# Patient Record
Sex: Female | Born: 1955
Health system: Southern US, Community
[De-identification: ages and names within clinical notes are randomized; demographics above are authoritative.]

## PROBLEM LIST (undated history)

## (undated) HISTORY — PX: PLACEMENT OF BREAST IMPLANTS: SHX6334

---

## 2012-01-18 ENCOUNTER — Ambulatory Visit: Payer: Self-pay

## 2012-02-05 ENCOUNTER — Ambulatory Visit: Payer: Self-pay

## 2018-03-21 DIAGNOSIS — M19031 Primary osteoarthritis, right wrist: Secondary | ICD-10-CM

## 2018-03-21 HISTORY — DX: Primary osteoarthritis, right wrist: M19.031

## 2018-05-17 ENCOUNTER — Other Ambulatory Visit: Payer: Self-pay | Admitting: Family Medicine

## 2018-05-17 DIAGNOSIS — N6489 Other specified disorders of breast: Secondary | ICD-10-CM

## 2018-05-23 ENCOUNTER — Ambulatory Visit
Admission: RE | Admit: 2018-05-23 | Discharge: 2018-05-23 | Disposition: A | Discharge status: Home / Self Care | Payer: Commercial Managed Care - PPO | Source: Ambulatory Visit | Attending: Family Medicine | Admitting: Family Medicine

## 2018-05-23 ENCOUNTER — Other Ambulatory Visit: Payer: Self-pay | Admitting: Family Medicine

## 2018-05-23 ENCOUNTER — Ambulatory Visit: Payer: Self-pay

## 2018-05-23 DIAGNOSIS — N6489 Other specified disorders of breast: Secondary | ICD-10-CM

## 2018-05-23 HISTORY — PX: BREAST BIOPSY: SHX20

## 2018-07-05 HISTORY — PX: BREAST BIOPSY: SHX20

## 2018-07-12 DIAGNOSIS — R922 Inconclusive mammogram: Secondary | ICD-10-CM

## 2018-07-12 HISTORY — DX: Inconclusive mammogram: R92.2

## 2019-04-08 IMAGING — MG STEREOTACTIC CORE NEEDLE BIOPSY
8 of 13 series · 8 of 29 positions shown · non-contrast
Comparison: Previous exams.

ADDENDUM:
Pathology revealed COMPLEX SCLEROSING LESION of the Left breast,
lower inner quadrant. This was found to be concordant by Dr.
Herlin Carolina, with excision recommended.

**Note that the clip significantly migrated at the time of the
biopsy, so the distortion itself, not the clip, will need to be
localized for excision.
Pathology results were discussed with the patient by telephone. The
patient reported doing well after the biopsy with tenderness at the
site. Post biopsy instructions and care were reviewed and questions
were answered. The patient was encouraged to call The [REDACTED]
A surgical referral will be arranged by Dr. Seifu Haji Yuye of [REDACTED] in [HOSPITAL][HOSPITAL]. Imaging and pathology reports were
faxed to Dr. Assasin Poyo on May 24, 2018.
Pathology results reported by Akame, RN on 05/24/2018.
CLINICAL DATA: 62-year-old female presenting for stereotactic
biopsy of distortion in the left breast.
EXAM:
LEFT BREAST STEREOTACTIC CORE NEEDLE BIOPSY

[L (1 of 8)]
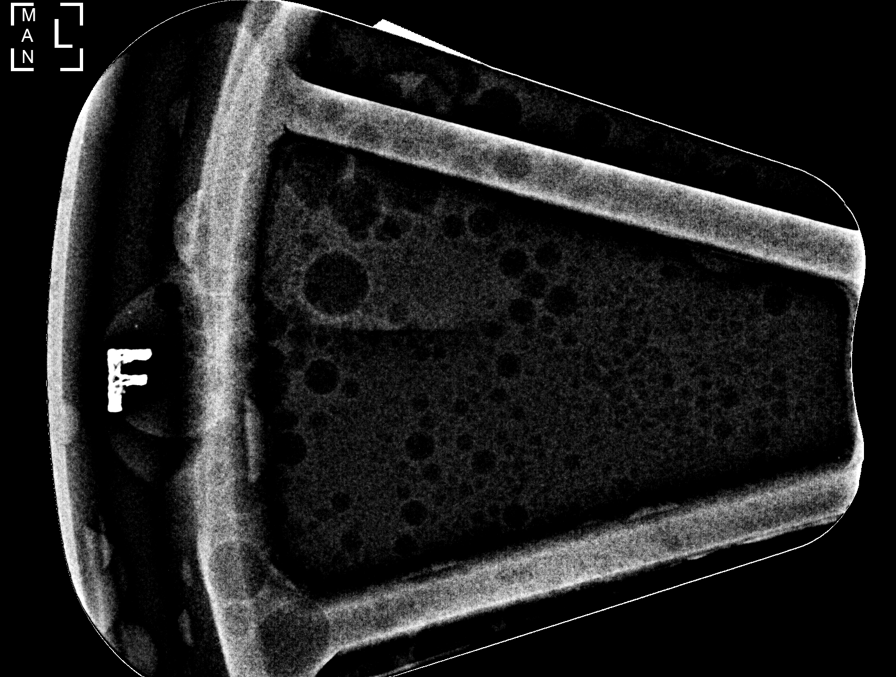

[L (2 of 8)]
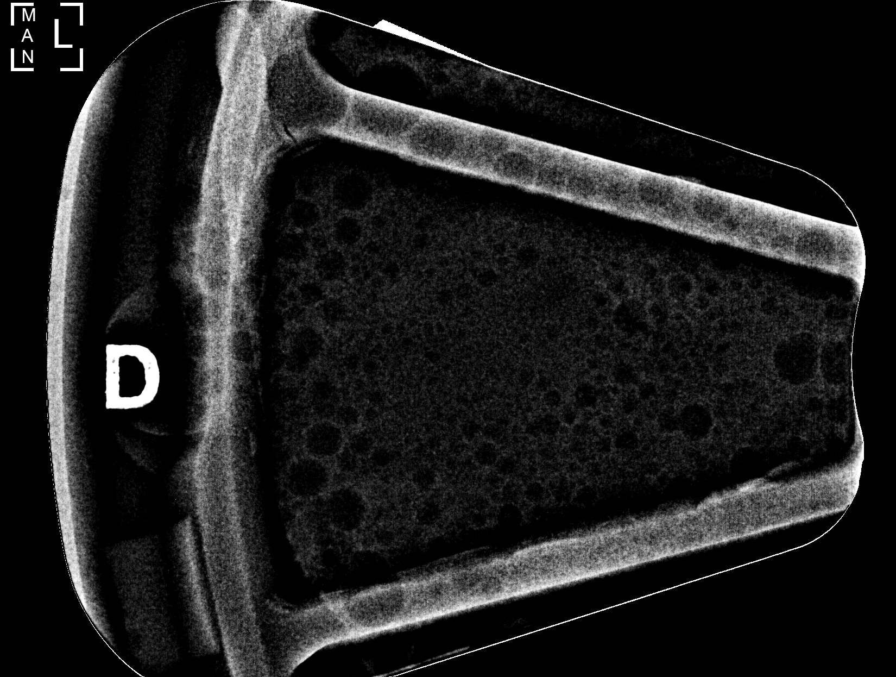

[L (3 of 8)]
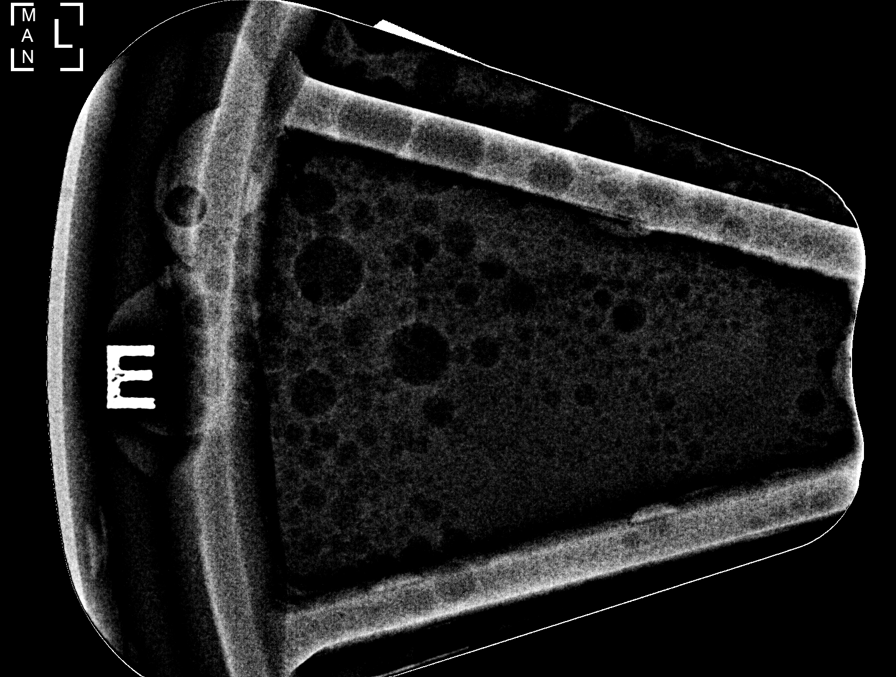

[L (4 of 8)]
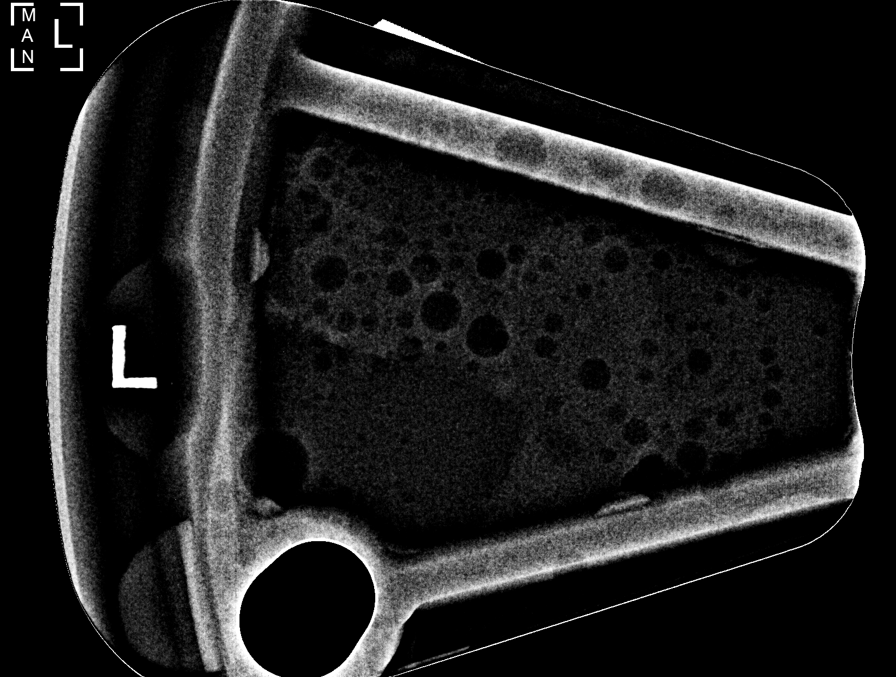

[L (5 of 8)]
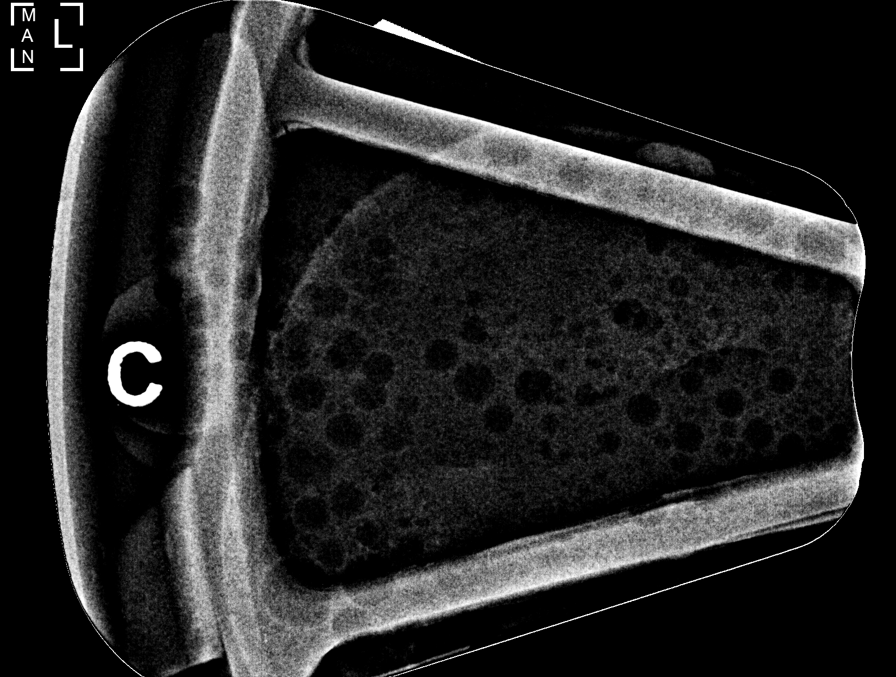

[L (6 of 8)]
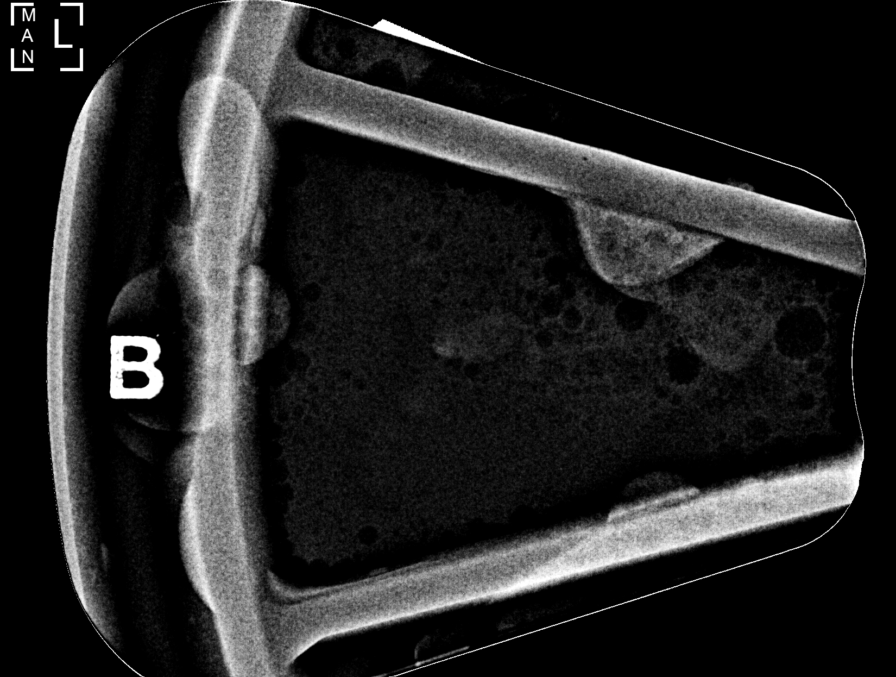

[L (7 of 8)]
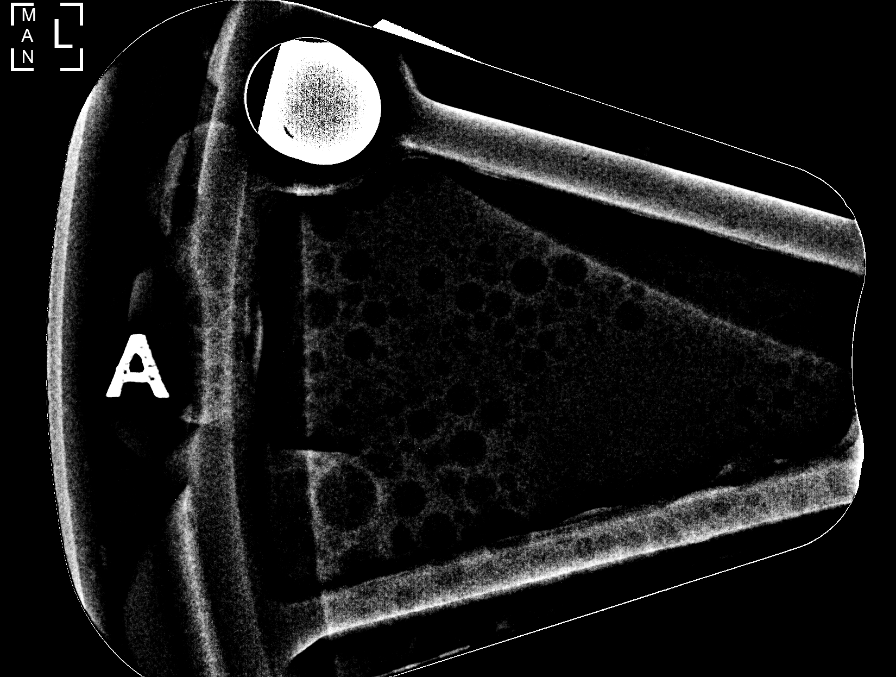

[L (8 of 8)]
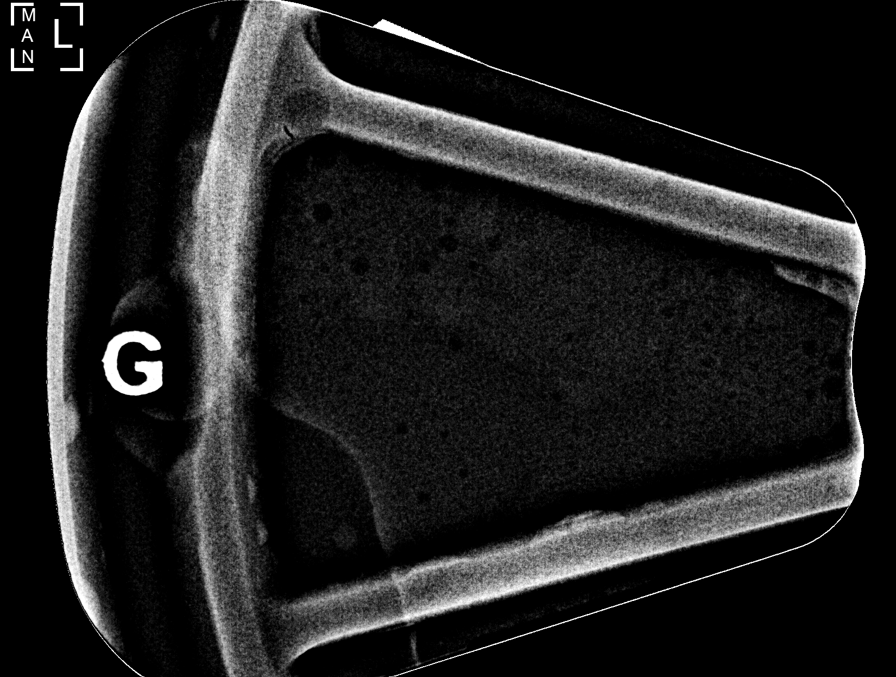

[8 of 29 positions shown; findings below may reference images not displayed]



Using sterile technique and 1% Lidocaine as local anesthetic, under
stereotactic guidance, a 9 gauge vacuum assisted device was used to
perform core needle biopsy of distortion in the lower slightly outer
left breast using a superior approach.

Lesion quadrant: Lower outer quadrant

At the conclusion of the procedure, a coil shaped tissue marker clip
was deployed into the biopsy cavity. Follow-up 2-view mammogram was
performed and dictated separately.
IMPRESSION: Stereotactic-guided biopsy of distortion in the lower outer left
breast. No apparent complications.

## 2019-11-09 DIAGNOSIS — I10 Essential (primary) hypertension: Secondary | ICD-10-CM | POA: Insufficient documentation

## 2019-11-09 DIAGNOSIS — G43009 Migraine without aura, not intractable, without status migrainosus: Secondary | ICD-10-CM | POA: Insufficient documentation

## 2019-11-09 DIAGNOSIS — E782 Mixed hyperlipidemia: Secondary | ICD-10-CM

## 2019-11-09 DIAGNOSIS — E039 Hypothyroidism, unspecified: Secondary | ICD-10-CM

## 2019-11-09 DIAGNOSIS — G8929 Other chronic pain: Secondary | ICD-10-CM | POA: Insufficient documentation

## 2019-11-09 DIAGNOSIS — K219 Gastro-esophageal reflux disease without esophagitis: Secondary | ICD-10-CM | POA: Insufficient documentation

## 2019-11-09 DIAGNOSIS — J302 Other seasonal allergic rhinitis: Secondary | ICD-10-CM | POA: Insufficient documentation

## 2019-11-09 HISTORY — DX: Hypothyroidism, unspecified: E03.9

## 2019-11-09 HISTORY — DX: Essential (primary) hypertension: I10

## 2019-11-09 HISTORY — DX: Other chronic pain: G89.29

## 2019-11-09 HISTORY — DX: Gastro-esophageal reflux disease without esophagitis: K21.9

## 2019-11-09 HISTORY — DX: Other seasonal allergic rhinitis: J30.2

## 2019-11-09 HISTORY — DX: Mixed hyperlipidemia: E78.2

## 2019-11-09 HISTORY — DX: Migraine without aura, not intractable, without status migrainosus: G43.009

## 2020-12-26 DIAGNOSIS — Z1331 Encounter for screening for depression: Secondary | ICD-10-CM | POA: Diagnosis not present

## 2020-12-26 DIAGNOSIS — E039 Hypothyroidism, unspecified: Secondary | ICD-10-CM | POA: Diagnosis not present

## 2020-12-26 DIAGNOSIS — Z6822 Body mass index (BMI) 22.0-22.9, adult: Secondary | ICD-10-CM | POA: Diagnosis not present

## 2020-12-26 DIAGNOSIS — Z9181 History of falling: Secondary | ICD-10-CM | POA: Diagnosis not present

## 2020-12-26 DIAGNOSIS — I10 Essential (primary) hypertension: Secondary | ICD-10-CM | POA: Diagnosis not present

## 2020-12-26 DIAGNOSIS — Z760 Encounter for issue of repeat prescription: Secondary | ICD-10-CM | POA: Diagnosis not present

## 2021-02-19 DIAGNOSIS — R194 Change in bowel habit: Secondary | ICD-10-CM | POA: Diagnosis not present

## 2021-02-19 DIAGNOSIS — K649 Unspecified hemorrhoids: Secondary | ICD-10-CM | POA: Diagnosis not present

## 2021-02-19 DIAGNOSIS — K219 Gastro-esophageal reflux disease without esophagitis: Secondary | ICD-10-CM | POA: Diagnosis not present

## 2021-03-06 DIAGNOSIS — Z09 Encounter for follow-up examination after completed treatment for conditions other than malignant neoplasm: Secondary | ICD-10-CM | POA: Diagnosis not present

## 2021-03-06 DIAGNOSIS — Z86010 Personal history of colonic polyps: Secondary | ICD-10-CM | POA: Diagnosis not present

## 2021-03-06 DIAGNOSIS — Z800 Family history of malignant neoplasm of digestive organs: Secondary | ICD-10-CM | POA: Diagnosis not present

## 2021-03-06 DIAGNOSIS — K644 Residual hemorrhoidal skin tags: Secondary | ICD-10-CM | POA: Diagnosis not present

## 2021-03-06 DIAGNOSIS — R194 Change in bowel habit: Secondary | ICD-10-CM | POA: Diagnosis not present

## 2021-03-06 DIAGNOSIS — R197 Diarrhea, unspecified: Secondary | ICD-10-CM | POA: Diagnosis not present

## 2021-03-06 DIAGNOSIS — Z87891 Personal history of nicotine dependence: Secondary | ICD-10-CM | POA: Diagnosis not present

## 2021-04-02 DIAGNOSIS — G43909 Migraine, unspecified, not intractable, without status migrainosus: Secondary | ICD-10-CM | POA: Diagnosis not present

## 2021-04-02 DIAGNOSIS — I10 Essential (primary) hypertension: Secondary | ICD-10-CM | POA: Diagnosis not present

## 2021-04-02 DIAGNOSIS — E039 Hypothyroidism, unspecified: Secondary | ICD-10-CM | POA: Diagnosis not present

## 2021-04-02 DIAGNOSIS — R7309 Other abnormal glucose: Secondary | ICD-10-CM | POA: Diagnosis not present

## 2021-04-02 DIAGNOSIS — Z760 Encounter for issue of repeat prescription: Secondary | ICD-10-CM | POA: Diagnosis not present

## 2021-04-02 DIAGNOSIS — M8580 Other specified disorders of bone density and structure, unspecified site: Secondary | ICD-10-CM | POA: Diagnosis not present

## 2021-04-02 DIAGNOSIS — Z79899 Other long term (current) drug therapy: Secondary | ICD-10-CM | POA: Diagnosis not present

## 2021-04-02 DIAGNOSIS — Z6823 Body mass index (BMI) 23.0-23.9, adult: Secondary | ICD-10-CM | POA: Diagnosis not present

## 2021-04-02 DIAGNOSIS — Z0000 Encounter for general adult medical examination without abnormal findings: Secondary | ICD-10-CM | POA: Diagnosis not present

## 2021-04-24 DIAGNOSIS — Z1231 Encounter for screening mammogram for malignant neoplasm of breast: Secondary | ICD-10-CM | POA: Diagnosis not present

## 2021-04-28 DIAGNOSIS — K649 Unspecified hemorrhoids: Secondary | ICD-10-CM | POA: Diagnosis not present

## 2021-04-28 DIAGNOSIS — K219 Gastro-esophageal reflux disease without esophagitis: Secondary | ICD-10-CM | POA: Diagnosis not present

## 2021-04-28 DIAGNOSIS — R194 Change in bowel habit: Secondary | ICD-10-CM | POA: Diagnosis not present

## 2021-05-15 DIAGNOSIS — Z6823 Body mass index (BMI) 23.0-23.9, adult: Secondary | ICD-10-CM | POA: Diagnosis not present

## 2021-05-15 DIAGNOSIS — Z23 Encounter for immunization: Secondary | ICD-10-CM | POA: Diagnosis not present

## 2021-05-15 DIAGNOSIS — B351 Tinea unguium: Secondary | ICD-10-CM | POA: Diagnosis not present

## 2021-05-15 DIAGNOSIS — I10 Essential (primary) hypertension: Secondary | ICD-10-CM | POA: Diagnosis not present

## 2021-05-29 ENCOUNTER — Ambulatory Visit: Payer: Self-pay | Admitting: Podiatry

## 2021-09-17 DIAGNOSIS — D225 Melanocytic nevi of trunk: Secondary | ICD-10-CM | POA: Diagnosis not present

## 2021-09-17 DIAGNOSIS — L814 Other melanin hyperpigmentation: Secondary | ICD-10-CM | POA: Diagnosis not present

## 2021-09-17 DIAGNOSIS — D2239 Melanocytic nevi of other parts of face: Secondary | ICD-10-CM | POA: Diagnosis not present

## 2021-09-17 DIAGNOSIS — D485 Neoplasm of uncertain behavior of skin: Secondary | ICD-10-CM | POA: Diagnosis not present

## 2021-09-17 DIAGNOSIS — L821 Other seborrheic keratosis: Secondary | ICD-10-CM | POA: Diagnosis not present

## 2021-10-09 DIAGNOSIS — C44319 Basal cell carcinoma of skin of other parts of face: Secondary | ICD-10-CM | POA: Diagnosis not present

## 2021-10-30 DIAGNOSIS — H2513 Age-related nuclear cataract, bilateral: Secondary | ICD-10-CM | POA: Diagnosis not present

## 2021-11-07 DIAGNOSIS — Z0100 Encounter for examination of eyes and vision without abnormal findings: Secondary | ICD-10-CM | POA: Diagnosis not present

## 2022-03-02 DIAGNOSIS — M1711 Unilateral primary osteoarthritis, right knee: Secondary | ICD-10-CM | POA: Diagnosis not present

## 2022-03-02 DIAGNOSIS — M25562 Pain in left knee: Secondary | ICD-10-CM | POA: Diagnosis not present

## 2022-03-02 DIAGNOSIS — M170 Bilateral primary osteoarthritis of knee: Secondary | ICD-10-CM | POA: Diagnosis not present

## 2022-03-02 DIAGNOSIS — M25561 Pain in right knee: Secondary | ICD-10-CM | POA: Diagnosis not present

## 2022-03-16 ENCOUNTER — Ambulatory Visit: Payer: Medicare HMO | Admitting: Podiatry

## 2022-03-16 ENCOUNTER — Encounter: Payer: Self-pay | Admitting: Podiatry

## 2022-03-16 DIAGNOSIS — L608 Other nail disorders: Secondary | ICD-10-CM | POA: Diagnosis not present

## 2022-03-16 NOTE — Progress Notes (Signed)
  Subjective:  Patient ID: Kathryn Herrera, female    DOB: [birth year],   MRN: [id number]  Chief Complaint  Patient presents with   Nail Problem      NP - BIL 2ND TOES - THICK NAILS / CURVED    66 y.o. female presents for concern of bilateral thickened second toenails and curvation of the nails. Denies any pain. Concern for possible fungus. Denies any other pedal complaints. Denies n/v/f/c.   History reviewed. No pertinent past medical history.  Objective:  Physical Exam: Vascular: DP/PT pulses 2/4 bilateral. CFT <3 seconds. Normal hair growth on digits. No edema.  Skin. No lacerations or abrasions bilateral feet. Pincer nail deformity bilateral second digits. Mild thickening but no discoloration of the nails Musculoskeletal: MMT 5/5 bilateral lower extremities in DF, PF, Inversion and Eversion. Deceased ROM in DF of ankle joint.  Neurological: Sensation intact to light touch.   Assessment:  No diagnosis found.   Plan:  Patient was evaluated and treated and all questions answered. -Examined patient -Discussed treatment options for painful dystrophic nails and pincer nails  -Affected nails debrided with nail nipper as courtesy.  -Discussed topical softeners such as urea nail gel  Return as needed.    Lorenda Peck, DPM

## 2022-03-17 DIAGNOSIS — C4401 Basal cell carcinoma of skin of lip: Secondary | ICD-10-CM | POA: Diagnosis not present

## 2022-04-06 DIAGNOSIS — Z0000 Encounter for general adult medical examination without abnormal findings: Secondary | ICD-10-CM | POA: Diagnosis not present

## 2022-04-06 DIAGNOSIS — Z79899 Other long term (current) drug therapy: Secondary | ICD-10-CM | POA: Diagnosis not present

## 2022-04-06 DIAGNOSIS — I10 Essential (primary) hypertension: Secondary | ICD-10-CM | POA: Diagnosis not present

## 2022-04-06 DIAGNOSIS — Z6822 Body mass index (BMI) 22.0-22.9, adult: Secondary | ICD-10-CM | POA: Diagnosis not present

## 2022-04-06 DIAGNOSIS — Z1331 Encounter for screening for depression: Secondary | ICD-10-CM | POA: Diagnosis not present

## 2022-04-06 DIAGNOSIS — E039 Hypothyroidism, unspecified: Secondary | ICD-10-CM | POA: Diagnosis not present

## 2022-04-06 DIAGNOSIS — M810 Age-related osteoporosis without current pathological fracture: Secondary | ICD-10-CM | POA: Diagnosis not present

## 2022-05-12 DIAGNOSIS — Z1231 Encounter for screening mammogram for malignant neoplasm of breast: Secondary | ICD-10-CM | POA: Diagnosis not present

## 2022-05-21 DIAGNOSIS — I10 Essential (primary) hypertension: Secondary | ICD-10-CM | POA: Diagnosis not present

## 2022-05-21 DIAGNOSIS — Z6822 Body mass index (BMI) 22.0-22.9, adult: Secondary | ICD-10-CM | POA: Diagnosis not present

## 2022-06-03 DIAGNOSIS — M170 Bilateral primary osteoarthritis of knee: Secondary | ICD-10-CM | POA: Diagnosis not present

## 2022-07-17 DIAGNOSIS — R002 Palpitations: Secondary | ICD-10-CM | POA: Diagnosis not present

## 2022-07-17 DIAGNOSIS — Z6821 Body mass index (BMI) 21.0-21.9, adult: Secondary | ICD-10-CM | POA: Diagnosis not present

## 2022-08-04 DIAGNOSIS — R002 Palpitations: Secondary | ICD-10-CM | POA: Diagnosis not present

## 2022-08-18 ENCOUNTER — Encounter: Payer: Self-pay | Admitting: *Deleted

## 2022-08-18 ENCOUNTER — Encounter: Payer: Self-pay | Admitting: Cardiology

## 2022-08-19 DIAGNOSIS — M170 Bilateral primary osteoarthritis of knee: Secondary | ICD-10-CM | POA: Diagnosis not present

## 2022-08-24 DIAGNOSIS — R936 Abnormal findings on diagnostic imaging of limbs: Secondary | ICD-10-CM | POA: Diagnosis not present

## 2022-08-24 DIAGNOSIS — M7122 Synovial cyst of popliteal space [Baker], left knee: Secondary | ICD-10-CM | POA: Diagnosis not present

## 2022-08-24 DIAGNOSIS — M25562 Pain in left knee: Secondary | ICD-10-CM | POA: Diagnosis not present

## 2022-08-24 DIAGNOSIS — M25462 Effusion, left knee: Secondary | ICD-10-CM | POA: Diagnosis not present

## 2022-08-24 DIAGNOSIS — M67462 Ganglion, left knee: Secondary | ICD-10-CM | POA: Diagnosis not present

## 2022-08-26 DIAGNOSIS — M1712 Unilateral primary osteoarthritis, left knee: Secondary | ICD-10-CM | POA: Diagnosis not present

## 2022-08-31 ENCOUNTER — Ambulatory Visit: Payer: Medicare HMO | Attending: Cardiology | Admitting: Cardiology

## 2022-08-31 ENCOUNTER — Encounter: Payer: Self-pay | Admitting: Cardiology

## 2022-08-31 VITALS — BP 108/60 | HR 94 | Ht 63.0 in | Wt 118.0 lb

## 2022-08-31 DIAGNOSIS — E782 Mixed hyperlipidemia: Secondary | ICD-10-CM | POA: Diagnosis not present

## 2022-08-31 DIAGNOSIS — I4719 Other supraventricular tachycardia: Secondary | ICD-10-CM | POA: Diagnosis not present

## 2022-08-31 DIAGNOSIS — I10 Essential (primary) hypertension: Secondary | ICD-10-CM

## 2022-08-31 MED ORDER — METOPROLOL SUCCINATE ER 25 MG PO TB24: 25.0000 mg | ORAL_TABLET | Freq: Every day | ORAL | 3 refills | Status: DC

## 2022-08-31 MED ORDER — LISINOPRIL 10 MG PO TABS: 10.0000 mg | ORAL_TABLET | Freq: Every day | ORAL | 3 refills | Status: DC

## 2022-08-31 NOTE — Patient Instructions (Signed)
Medication Instructions:   DECREASE: Lisinopril to '10mg'$  daily - You may half your current dose and your next refill will reflect your new dose.  START: Metoprolol Succinate '25mg'$  1 tablet daily   Lab Work: None Ordered If you have labs (blood work) drawn today and your tests are completely normal, you will receive your results only by: MyChart Message (if you have MyChart) OR A paper copy in the mail If you have any lab test that is abnormal or we need to change your treatment, we will call you to review the results.   Testing/Procedures: Your physician has requested that you have an echocardiogram. Echocardiography is a painless test that uses sound waves to create images of your heart. It provides your doctor with information about the size and shape of your heart and how well your heart's chambers and valves are working. This procedure takes approximately one hour. There are no restrictions for this procedure. Please do NOT wear cologne, perfume, aftershave, or lotions (deodorant is allowed). Please arrive 15 minutes prior to your appointment time.    Follow-Up: At [facility], you and your health needs are our priority.  As part of our continuing mission to provide you with exceptional heart care, we have created designated Provider Care Teams.  These Care Teams include your primary Cardiologist (physician) and Advanced Practice Providers (APPs -  Physician Assistants and Nurse Practitioners) who all work together to provide you with the care you need, when you need it.  We recommend signing up for the patient portal called "MyChart".  Sign up information is provided on this After Visit Summary.  MyChart is used to connect with patients for Virtual Visits (Telemedicine).  Patients are able to view lab/test results, encounter notes, upcoming appointments, etc.  Non-urgent messages can be sent to your provider as well.   To learn more about what you can do with MyChart, go to  [email or web address].    Your next appointment:   6 month(s)  The format for your next appointment:   In Person  Provider:   Jenne Campus, MD    Other Instructions NA

## 2022-08-31 NOTE — Progress Notes (Unsigned)
Cardiology Consultation:    Date:  08/31/2022   ID:  Kathryn Herrera, DOB 05/14/1956, MRN [id number]  PCP:  Ronita Hipps, MD  Cardiologist:  Jenne Campus, MD   Referring MD: Ronita Hipps, MD   Chief Complaint  Patient presents with   ventricular tachycardia    History of Present Illness:    Kathryn Herrera is a 67 y.o. female who is being seen today for the evaluation of ventricular tachycardia at the request of Ronita Hipps, MD. past medical history significant for osteoarthritis, osteoporosis, essential hypertension, dyslipidemia, she was referred to [place] because she got monitor done for some palpitation she was found to have ventricular tachycardia, however careful analysis of monitor showed that this is atrial tachycardia, no ventricular tachycardia.  She being very alarmed by that she described episode that she feels transient that that episode can last up to 30 minutes she described to have some skipped beats.  Otherwise she is trying to be active does have some osteoarthritis in her knee which make difficult for her to walk.  Denies have any chest pain tightness squeezing pressure burning chest.  She quit smoking 2004, she does have essential hypertension which is well-managed, she is not on any special diet.  She does have family of premature coronary artery disease her father had heart attack early but he was a heavy smoker.  Past Medical History:  Diagnosis Date   Acquired hypothyroidism 11/09/2019   Chronic pain of right knee 11/09/2019   Essential hypertension 11/09/2019   Gastroesophageal reflux disease without esophagitis 11/09/2019   Inconclusive mammogram 07/12/2018   Migraine without aura and without status migrainosus, not intractable 11/09/2019   Mixed hyperlipidemia 11/09/2019   Primary osteoarthritis of right wrist 03/21/2018   Seasonal allergies 11/09/2019    Past Surgical History:  Procedure Laterality Date   BREAST BIOPSY Left 05/23/2018    Performed at [facility] [place]/ Stereotactic   BREAST BIOPSY Left 07/05/2018   By Dr. Dorann Ou at [facility]/ Needle Biopsy   PLACEMENT OF BREAST IMPLANTS      Current Medications: Current Meds  Medication Sig   alendronate (FOSAMAX) 70 MG tablet Take 70 mg by mouth once a week.   Ascorbic Acid (VITAMIN C) 1000 MG tablet Take 1,000 mg by mouth daily.   CALCIUM CITRATE PO Take 1,000 mg by mouth in the morning and at bedtime.   levothyroxine (SYNTHROID) 75 MCG tablet Take 75 mcg by mouth daily.   lisinopril (ZESTRIL) 20 MG tablet Take 20 mg by mouth daily.   montelukast (SINGULAIR) 10 MG tablet Take 10 mg by mouth daily.   Multiple Vitamin (MULTIVITAMIN) capsule Take 1 capsule by mouth daily.   omeprazole (PRILOSEC) 20 MG capsule Take 20 mg by mouth daily.   pravastatin (PRAVACHOL) 20 MG tablet Take 20 mg by mouth at bedtime.   SUMAtriptan (IMITREX) 100 MG tablet Take 100 mg by mouth every 2 (two) hours as needed for migraine. May repeat in 2 hours if headache persists or recurs.   VITAMIN D, CHOLECALCIFEROL, PO Take 630 mg by mouth 2 (two) times daily.   Wheat Dextrin (BENEFIBER PO) Take 1 tablet by mouth daily.     Allergies:   Amoxicillin, Ampicillin, Levaquin [levofloxacin], and Meloxicam   Social History   Socioeconomic History   Marital status: Divorced    Spouse name: Not on file   Number of children: Not on file   Years of education: Not on file  Highest education level: Not on file  Occupational History   Not on file  Tobacco Use   Smoking status: Former    Types: Cigarettes    Quit date: 2004    Years since quitting: 20.1   Smokeless tobacco: Never  Substance and Sexual Activity   Alcohol use: Not on file   Drug use: Not on file   Sexual activity: Not on file  Other Topics Concern   Not on file  Social History Narrative   Not on file   Social Determinants of Health   Financial Resource Strain: Not on file  Food Insecurity: Not on file   Transportation Needs: Not on file  Physical Activity: Not on file  Stress: Not on file  Social Connections: Not on file     Family History: The patient's family history includes Breast cancer in her sister; Migraines in her maternal grandmother, mother, and sister. ROS:   Please see the history of present illness.    All 14 point review of systems negative except as described per history of present illness.  EKGs/Labs/Other Studies Reviewed:    The following studies were reviewed today: Monitor shows 6 episode of atrial tachycardia longest episode 7 beats average heart rate 167.  Less than 0.1% burden of PACs, 0.4% PVCs.  EKG:  EKG is ordered today.  The ekg ordered today demonstrates normal sinus rhythm, normal P interval, normal QS complex duration morphology no ST segment changes  Recent Labs: No results found for requested labs within last 365 days.  Recent Lipid Panel No results found for: "CHOL", "TRIG", "HDL", "CHOLHDL", "VLDL", "LDLCALC", "LDLDIRECT"  Physical Exam:    VS:  BP 108/60 (BP Location: Left Arm, Patient Position: Sitting)   Pulse 94   Ht '5\' 3"'$  (1.6 m)   Wt 118 lb (53.5 kg)   SpO2 96%   BMI 20.90 kg/m     Wt Readings from Last 3 Encounters:  08/31/22 118 lb (53.5 kg)  07/17/22 120 lb (54.4 kg)     GEN:  Well nourished, well developed in no acute distress HEENT: Normal NECK: No JVD; No carotid bruits LYMPHATICS: No lymphadenopathy CARDIAC: RRR, no murmurs, no rubs, no gallops RESPIRATORY:  Clear to auscultation without rales, wheezing or rhonchi  ABDOMEN: Soft, non-tender, non-distended MUSCULOSKELETAL:  No edema; No deformity  SKIN: Warm and dry NEUROLOGIC:  Alert and oriented x 3 PSYCHIATRIC:  Normal affect   ASSESSMENT:    1. Atrial tachycardia   2. Essential hypertension   3. Mixed hyperlipidemia    PLAN:    In order of problems listed above:  Atrial tachycardia this is fairly benign finding.  But since she is symptomatic I will try  to suppress this.  I will give her metoprolol succinate 25 mg daily, I will ask her to cut down lisinopril in half to only 10 mg daily to make sure she will not become hypotensive.  As a part of general assessment of her heart I will schedule her to have echocardiogram especially since she does have shortness of breath Essential hypertension blood pressure seems to well-controlled.  But again anticipate usage of beta-blocker that can create some hypotensive data that that is why I will reduce dose of lisinopril. Mixed dyslipidemia I did review her K PN which show me her LDL very nicely controlled.  It is 67.  HDL 60  Was chaperone during entire visit by my nurse's assistant A. Harris Medication Adjustments/Labs and Tests Ordered: Current medicines are reviewed at  length with the patient today.  Concerns regarding medicines are outlined above.  No orders of the defined types were placed in this encounter.  No orders of the defined types were placed in this encounter.   Signed, Park Liter, MD, [facility]. 08/31/2022 3:22 PM    [facility]

## 2022-09-09 DIAGNOSIS — M170 Bilateral primary osteoarthritis of knee: Secondary | ICD-10-CM | POA: Diagnosis not present

## 2022-09-11 ENCOUNTER — Ambulatory Visit: Payer: Medicare HMO | Attending: Cardiology

## 2022-09-11 DIAGNOSIS — I4719 Other supraventricular tachycardia: Secondary | ICD-10-CM | POA: Diagnosis not present

## 2022-09-11 DIAGNOSIS — I517 Cardiomegaly: Secondary | ICD-10-CM

## 2022-09-11 DIAGNOSIS — I10 Essential (primary) hypertension: Secondary | ICD-10-CM | POA: Diagnosis not present

## 2022-09-11 LAB — ECHOCARDIOGRAM COMPLETE
Area-P 1/2: 3.99 cm2
P 1/2 time: 481 msec
S' Lateral: 2.5 cm

## 2022-09-16 ENCOUNTER — Telehealth: Payer: Self-pay

## 2022-09-16 NOTE — Telephone Encounter (Signed)
Results reviewed with pt as per Dr. Krasowski's note.  Pt verbalized understanding and had no additional questions. Routed to PCP  

## 2022-09-23 DIAGNOSIS — L814 Other melanin hyperpigmentation: Secondary | ICD-10-CM | POA: Diagnosis not present

## 2022-09-23 DIAGNOSIS — D2239 Melanocytic nevi of other parts of face: Secondary | ICD-10-CM | POA: Diagnosis not present

## 2022-09-23 DIAGNOSIS — D225 Melanocytic nevi of trunk: Secondary | ICD-10-CM | POA: Diagnosis not present

## 2022-09-23 DIAGNOSIS — L821 Other seborrheic keratosis: Secondary | ICD-10-CM | POA: Diagnosis not present

## 2022-09-23 DIAGNOSIS — D485 Neoplasm of uncertain behavior of skin: Secondary | ICD-10-CM | POA: Diagnosis not present

## 2022-09-23 DIAGNOSIS — L820 Inflamed seborrheic keratosis: Secondary | ICD-10-CM | POA: Diagnosis not present

## 2022-11-18 DIAGNOSIS — H2513 Age-related nuclear cataract, bilateral: Secondary | ICD-10-CM | POA: Diagnosis not present

## 2022-12-17 DIAGNOSIS — M170 Bilateral primary osteoarthritis of knee: Secondary | ICD-10-CM | POA: Diagnosis not present

## 2023-01-27 DIAGNOSIS — M170 Bilateral primary osteoarthritis of knee: Secondary | ICD-10-CM | POA: Diagnosis not present

## 2023-04-05 ENCOUNTER — Ambulatory Visit: Payer: Medicare HMO | Admitting: Cardiology

## 2023-04-12 DIAGNOSIS — I10 Essential (primary) hypertension: Secondary | ICD-10-CM | POA: Diagnosis not present

## 2023-04-12 DIAGNOSIS — E039 Hypothyroidism, unspecified: Secondary | ICD-10-CM | POA: Diagnosis not present

## 2023-04-12 DIAGNOSIS — Z79899 Other long term (current) drug therapy: Secondary | ICD-10-CM | POA: Diagnosis not present

## 2023-04-12 DIAGNOSIS — Z1331 Encounter for screening for depression: Secondary | ICD-10-CM | POA: Diagnosis not present

## 2023-04-12 DIAGNOSIS — Z760 Encounter for issue of repeat prescription: Secondary | ICD-10-CM | POA: Diagnosis not present

## 2023-04-12 DIAGNOSIS — Z1339 Encounter for screening examination for other mental health and behavioral disorders: Secondary | ICD-10-CM | POA: Diagnosis not present

## 2023-04-12 DIAGNOSIS — Z6821 Body mass index (BMI) 21.0-21.9, adult: Secondary | ICD-10-CM | POA: Diagnosis not present

## 2023-04-12 DIAGNOSIS — M810 Age-related osteoporosis without current pathological fracture: Secondary | ICD-10-CM | POA: Diagnosis not present

## 2023-04-12 DIAGNOSIS — Z0000 Encounter for general adult medical examination without abnormal findings: Secondary | ICD-10-CM | POA: Diagnosis not present

## 2023-05-04 DIAGNOSIS — Z23 Encounter for immunization: Secondary | ICD-10-CM | POA: Diagnosis not present

## 2023-05-06 DIAGNOSIS — M170 Bilateral primary osteoarthritis of knee: Secondary | ICD-10-CM | POA: Diagnosis not present

## 2023-05-14 DIAGNOSIS — Z1231 Encounter for screening mammogram for malignant neoplasm of breast: Secondary | ICD-10-CM | POA: Diagnosis not present

## 2023-05-14 DIAGNOSIS — Z23 Encounter for immunization: Secondary | ICD-10-CM | POA: Diagnosis not present

## 2023-05-17 ENCOUNTER — Encounter: Payer: Self-pay | Admitting: Cardiology

## 2023-05-17 ENCOUNTER — Ambulatory Visit: Payer: Medicare HMO | Attending: Cardiology | Admitting: Cardiology

## 2023-05-17 VITALS — BP 110/82 | HR 83 | Ht 62.0 in | Wt 120.8 lb

## 2023-05-17 DIAGNOSIS — I4719 Other supraventricular tachycardia: Secondary | ICD-10-CM | POA: Diagnosis not present

## 2023-05-17 DIAGNOSIS — K219 Gastro-esophageal reflux disease without esophagitis: Secondary | ICD-10-CM | POA: Diagnosis not present

## 2023-05-17 DIAGNOSIS — I10 Essential (primary) hypertension: Secondary | ICD-10-CM | POA: Diagnosis not present

## 2023-05-17 DIAGNOSIS — E782 Mixed hyperlipidemia: Secondary | ICD-10-CM | POA: Diagnosis not present

## 2023-05-17 NOTE — Progress Notes (Signed)
Cardiology Office Note:    Date:  05/17/2023   ID:  Kathryn Herrera, DOB Jun 06, 1956, MRN [id number]  PCP:  Marylen Ponto, MD  Cardiologist:  Gypsy Balsam, MD    Referring MD: Marylen Ponto, MD   Chief Complaint  Patient presents with   Follow-up    History of Present Illness:    Kathryn Herrera is a 67 y.o. female with past medical history significant for atrial tachycardia, essential hypertension, dyslipidemia, gastroesophageal reflux disease, she was sent to [place] for evaluation of her tachyarrhythmia.  She is completely asymptomatic denies have any chest pain tightness squeezing pressure burning chest, I gave her a small dose of beta-blocker, as a part of stratification she had echocardiogram done which showed preserved left ventricle ejection fraction.  She comes today to talk about this issue she feels well doing well complain of having some joint aches which slows her down.  Past Medical History:  Diagnosis Date   Acquired hypothyroidism 11/09/2019   Chronic pain of right knee 11/09/2019   Essential hypertension 11/09/2019   Gastroesophageal reflux disease without esophagitis 11/09/2019   Inconclusive mammogram 07/12/2018   Migraine without aura and without status migrainosus, not intractable 11/09/2019   Mixed hyperlipidemia 11/09/2019   Primary osteoarthritis of right wrist 03/21/2018   Seasonal allergies 11/09/2019    Past Surgical History:  Procedure Laterality Date   BREAST BIOPSY Left 05/23/2018   Performed at [facility] [place]/ Stereotactic   BREAST BIOPSY Left 07/05/2018   By Dr. Sherilyn Cooter at [facility]/ Needle Biopsy   PLACEMENT OF BREAST IMPLANTS      Current Medications: Current Meds  Medication Sig   Ascorbic Acid (VITAMIN C) 1000 MG tablet Take 1,000 mg by mouth daily.   CALCIUM CITRATE PO Take 1,000 mg by mouth in the morning and at bedtime.   ibandronate (BONIVA) 150 MG tablet Take 150 mg by mouth every 30 (thirty) days.  Take in the morning with a full glass of water, on an empty stomach, and do not take anything else by mouth or lie down for the next 30 min.   levothyroxine (SYNTHROID) 75 MCG tablet Take 75 mcg by mouth daily.   lisinopril (ZESTRIL) 10 MG tablet Take 1 tablet (10 mg total) by mouth daily.   metoprolol succinate (TOPROL XL) 25 MG 24 hr tablet Take 1 tablet (25 mg total) by mouth daily.   montelukast (SINGULAIR) 10 MG tablet Take 10 mg by mouth daily.   Multiple Vitamin (MULTIVITAMIN) capsule Take 1 capsule by mouth daily.   omeprazole (PRILOSEC) 20 MG capsule Take 20 mg by mouth daily.   pravastatin (PRAVACHOL) 20 MG tablet Take 20 mg by mouth at bedtime.   SUMAtriptan (IMITREX) 100 MG tablet Take 100 mg by mouth every 2 (two) hours as needed for migraine. May repeat in 2 hours if headache persists or recurs.   VITAMIN D, CHOLECALCIFEROL, PO Take 630 mg by mouth 2 (two) times daily.   Wheat Dextrin (BENEFIBER PO) Take 1 tablet by mouth daily.   [DISCONTINUED] alendronate (FOSAMAX) 70 MG tablet Take 70 mg by mouth once a week.     Allergies:   Amoxicillin, Ampicillin, Levaquin [levofloxacin], and Meloxicam   Social History   Socioeconomic History   Marital status: Divorced    Spouse name: Not on file   Number of children: Not on file   Years of education: Not on file   Highest education level: Not on file  Occupational History  Not on file  Tobacco Use   Smoking status: Former    Current packs/day: 0.00    Types: Cigarettes    Quit date: 2004    Years since quitting: 20.8   Smokeless tobacco: Never  Substance and Sexual Activity   Alcohol use: Not on file   Drug use: Not on file   Sexual activity: Not on file  Other Topics Concern   Not on file  Social History Narrative   Not on file   Social Determinants of Health   Financial Resource Strain: Not on file  Food Insecurity: Not on file  Transportation Needs: Not on file  Physical Activity: Not on file  Stress: Not on  file  Social Connections: Not on file     Family History: The patient's family history includes Breast cancer in her sister; Migraines in her maternal grandmother, mother, and sister. ROS:   Please see the history of present illness.    All 14 point review of systems negative except as described per history of present illness  EKGs/Labs/Other Studies Reviewed:         Recent Labs: No results found for requested labs within last 365 days.  Recent Lipid Panel No results found for: "CHOL", "TRIG", "HDL", "CHOLHDL", "VLDL", "LDLCALC", "LDLDIRECT"  Physical Exam:    VS:  BP 110/82 (BP Location: Left Arm, Patient Position: Sitting)   Pulse 83   Ht 5\' 2"  (1.575 m)   Wt 120 lb 12.8 oz (54.8 kg)   SpO2 99%   BMI 22.09 kg/m     Wt Readings from Last 3 Encounters:  05/17/23 120 lb 12.8 oz (54.8 kg)  08/31/22 118 lb (53.5 kg)  07/17/22 120 lb (54.4 kg)     GEN:  Well nourished, well developed in no acute distress HEENT: Normal NECK: No JVD; No carotid bruits LYMPHATICS: No lymphadenopathy CARDIAC: RRR, no murmurs, no rubs, no gallops RESPIRATORY:  Clear to auscultation without rales, wheezing or rhonchi  ABDOMEN: Soft, non-tender, non-distended MUSCULOSKELETAL:  No edema; No deformity  SKIN: Warm and dry LOWER EXTREMITIES: no swelling NEUROLOGIC:  Alert and oriented x 3 PSYCHIATRIC:  Normal affect   ASSESSMENT:    1. Atrial tachycardia (HCC)   2. Essential hypertension   3. Gastroesophageal reflux disease without esophagitis   4. Mixed hyperlipidemia    PLAN:    In order of problems listed above:  Atrial tachycardia.  Small dose of beta-blocker will continue. Essential hypertension blood pressure well-controlled continue present management. Gastroesophageal reflux disease stable without any symptoms. Mixed dyslipidemia I did review K PN which show me data from September of this year with total cholesterol 185 HDL 51. I offer her calcium score to get better  understanding when she is standing with potential coronary artery disease.  She wants to wait until the end of this year before she does not   Medication Adjustments/Labs and Tests Ordered: Current medicines are reviewed at length with the patient today.  Concerns regarding medicines are outlined above.  No orders of the defined types were placed in this encounter.  Medication changes: No orders of the defined types were placed in this encounter.   Signed, Georgeanna Lea, MD, [facility] 05/17/2023 2:48 PM    [facility]

## 2023-05-17 NOTE — Patient Instructions (Signed)

## 2023-05-31 ENCOUNTER — Telehealth: Payer: Self-pay | Admitting: Cardiology

## 2023-05-31 NOTE — Telephone Encounter (Signed)
Pt returning call

## 2023-05-31 NOTE — Telephone Encounter (Signed)
Patient called and said that at her appt in October, they discussed about her getting a CT Angiogram. Wants to see if she can set that up for January. Told patient that order hasn't been placed yet. Wants a call back

## 2023-05-31 NOTE — Telephone Encounter (Signed)
LVM to call regarding setting up Calcium score

## 2023-06-01 ENCOUNTER — Telehealth: Payer: Self-pay

## 2023-06-01 DIAGNOSIS — Z8249 Family history of ischemic heart disease and other diseases of the circulatory system: Secondary | ICD-10-CM

## 2023-06-01 NOTE — Telephone Encounter (Signed)
Pt calling to [facility] at [facility] [place] when available. Order entered.

## 2023-06-09 ENCOUNTER — Telehealth: Payer: Self-pay | Admitting: Cardiology

## 2023-06-09 NOTE — Telephone Encounter (Signed)
*  STAT* If patient is at the pharmacy, call can be transferred to refill team.   1. Which medications need to be refilled? (please list name of each medication and dose if known)  metoprolol succinate (TOPROL XL) 25 MG 24 hr tablet    2. Would you like to learn more about the convenience, safety, & potential cost savings by using the [facility]? N/A  3. Are you open to using the [facility] (Type [facility]. N/A   4. Which pharmacy/location (including street and city if local pharmacy) is medication to be sent to?  [facility] [phone] - [place], [place] - [address]     5. Do they need a 30 day or 90 day supply? 90 day

## 2023-06-10 ENCOUNTER — Other Ambulatory Visit: Payer: Self-pay

## 2023-06-10 MED ORDER — METOPROLOL SUCCINATE ER 25 MG PO TB24: 25.0000 mg | ORAL_TABLET | Freq: Every day | ORAL | 3 refills | Status: DC

## 2023-06-10 NOTE — Telephone Encounter (Signed)
Metoprolol succinate 25mg  daily #90 ref x 3 sent to [facility]

## 2023-06-30 ENCOUNTER — Telehealth: Payer: Self-pay | Admitting: Cardiology

## 2023-06-30 NOTE — Telephone Encounter (Signed)
Pt called back in to sch CT cardiac score. I went to sch but [facility] not listed as a location yet. She asked when will it be available.

## 2023-06-30 NOTE — Telephone Encounter (Signed)
Spoke with pt regarding CT Score to be done at [facility] when it opens and her appt in April. Sent to front desk for appt.

## 2023-08-03 ENCOUNTER — Other Ambulatory Visit: Payer: Self-pay

## 2023-08-03 MED ORDER — METOPROLOL SUCCINATE ER 25 MG PO TB24: 25.0000 mg | ORAL_TABLET | Freq: Every day | ORAL | 2 refills | Status: DC

## 2023-08-03 MED ORDER — LISINOPRIL 10 MG PO TABS: 10.0000 mg | ORAL_TABLET | Freq: Every day | ORAL | 2 refills | Status: DC

## 2023-08-11 DIAGNOSIS — M170 Bilateral primary osteoarthritis of knee: Secondary | ICD-10-CM | POA: Diagnosis not present

## 2023-08-11 NOTE — Progress Notes (Signed)
 SPORTS MEDICINE RETURN VISIT  ASSESSMENT AND PLAN    Diagnosis ICD-10-CM Associated Orders  1. Primary osteoarthritis of left knee  M17.12 Lg Joint Inj: L knee    2. Primary osteoarthritis of right knee  M17.11            Interested in repeating steroid injections left knee steroid injection given. Right knee not as bothersome, will wait on steroid injection  Return if symptoms worsen or fail to improve.     Procedure(s):  Procedure:  After discussing risks and benefits of steroid injection, including bleeding, infection, lipodystrophy, transient increase in blood sugar, and the possibility that it may not work, statistician to proceed. A timeout was performed, verifying the correct patient, side/sites, and procedure. A mixture of 20 mg kenalog and 4 mL 1% lidocaine  were injected into the intraarticular left knee(s) . Reports no improvement in symptoms today in clinic. Advised to limit activity, especially walking for exercise for the next 3 days. If feeling ok, may gradually advance activities as tolerated. May ice, elevate, and wrap with ACE wrap for swelling over the next few days.     SUBJECTIVE   Chief Complaint:  Chief Complaint  Patient presents with  . Right Knee - Injection(s), Pain  . Left Knee - Injection(s), Pain    History of Present Illness: 68 y.o. female who presents for bilateral knee pain. No injury. I last saw her in clinic 05/06/2023, steroid injections given bilateral knees.  Left knee more bothersome than right.  Describes pain as sore.  Localizes pain to the medial joint line.  Takes Tylenol which has not been helping, occasionally takes diclofenac 75 mg which at times helps and other times does not help at all  Past Medical History:  Past Medical History:  Diagnosis Date  . Arthritis   . Hypertension    Work/school: retired, worked on sports coach x 30 years, active around house  OBJECTIVE   Physical Exam: Vitals:  Wt Readings from Last 3  Encounters:  08/11/23 55.5 kg (122 lb 6.4 oz)  05/06/23 55.5 kg (122 lb 4.8 oz)  12/17/22 60.8 kg (134 lb)   Estimated body mass index is 21.68 kg/m as calculated from the following:   Height as of this encounter: 160 cm (5' 3).   Weight as of this encounter: 55.5 kg (122 lb 6.4 oz). Gen: Well-appearing female in no acute distress MSK: Left knee: mild PF crepitus. mildly TTP medial joint line. Nontender to palpation lateral joint line, pes bursa.  No pain with hip ER, IR.  Gait even and steady no assistive device.      Imaging/other tests: MRI of the left knee 08/24/2022 shows severe degenerative changes medial compartment, moderate degenerative changes patellofemoral compartment.   Xrays of the bilateral knees from 03/02/2022 show on the right: severely decreased medial compartment joint space with moderate degenerative changes patellofemoral compartment, osteophytes medial femoral condyle, medial tibial plateau; on the left: moderately decreased medial compartment joint space with mild degenerative changes patellofemoral compartment.  No repeat xrays today.   @SPORTSPROMIS @   ADMINISTRATIVE   I have personally reviewed and interpreted the images (as available). Point-of-care ultrasound imaging is on file and stored in a permanent location (if performed). I have personally reviewed prior records and incorporated relevant information above (as available).  MEDICAL DECISION MAKING (level of service defined by 2/3 elements)   Number/Complexity of Problems Addressed 1 or more chronic illnesses with exacerbation, progression, or side effects of treatment (99204/99214)  Amount/Complexity  of Data to be Reviewed/Analyzed 2 points: Review prior notes (1 point per unique source); Review test results (1 point per unique test); Order tests (1 point per unique test) (99203/99213)  Risk of Complications/Morbidity/Mortality of Management Prescription Medication (99204/99214)   TIME   Total Time  for E/M Services on the Date of Encounter 99213 - I personally spent 20-29 minutes face-to-face and non-face-to-face in the care of this patient, excluding time spent during separately reported procedures, but including all pre, intra, and post visit time on the date of service.   CONSULTATION   Consultation services provided No   MODIFIER -25   Significant, Separately Billable Evaluation and Management YES - The patient's condition required a significant, separately identifiable, medically necessary evaluation and management service by the provider in addition to the procedure performed on the same date of service. The evaluation and management service was above and beyond the usual preoperative and postoperative care associated with the procedure.    PROCEDURES   Lg Joint Inj: L knee on 08/11/2023 11:15 AM Laterality: left Location: knee Medications: 20 mg triamcinolone  acetonide 40 mg/mL  + 4 mL 1% lidocaine Procedure, treatment alternatives, risks and benefits explained, specific risks discussed. Consent was given by the patient. Immediately prior to procedure a time out was called to verify the correct patient, procedure, equipment, support staff and site/side marked as required.   Medical Care Team Attestation: All ProcDoc orders were read back and verbally confirmed with the procedure provider, including but not limited to patient name, medication name, dose, and route, before any actions were taken.     DME   DME ORDER: Dx:  ,

## 2023-09-03 ENCOUNTER — Other Ambulatory Visit (HOSPITAL_BASED_OUTPATIENT_CLINIC_OR_DEPARTMENT_OTHER): Payer: Medicare HMO | Admitting: Radiology

## 2023-09-10 ENCOUNTER — Ambulatory Visit (HOSPITAL_BASED_OUTPATIENT_CLINIC_OR_DEPARTMENT_OTHER)
Admission: RE | Admit: 2023-09-10 | Discharge: 2023-09-10 | Disposition: A | Discharge status: Home / Self Care | Payer: Self-pay | Source: Ambulatory Visit | Attending: Cardiology | Admitting: Cardiology

## 2023-09-10 DIAGNOSIS — Z8249 Family history of ischemic heart disease and other diseases of the circulatory system: Secondary | ICD-10-CM

## 2023-09-10 DIAGNOSIS — I2510 Atherosclerotic heart disease of native coronary artery without angina pectoris: Secondary | ICD-10-CM | POA: Diagnosis not present

## 2023-09-17 ENCOUNTER — Telehealth: Payer: Self-pay

## 2023-09-17 NOTE — Telephone Encounter (Signed)
-----   Message from Gypsy Balsam sent at 09/16/2023  9:54 AM EST ----- Calcium score 29 point which is tolerated below.  Will talk about significance of this during the visit

## 2023-09-17 NOTE — Telephone Encounter (Signed)
 Patient notified of results and verbalized understanding.

## 2023-10-21 DIAGNOSIS — R519 Headache, unspecified: Secondary | ICD-10-CM | POA: Diagnosis not present

## 2023-10-21 DIAGNOSIS — E039 Hypothyroidism, unspecified: Secondary | ICD-10-CM | POA: Diagnosis not present

## 2023-10-21 DIAGNOSIS — Z6822 Body mass index (BMI) 22.0-22.9, adult: Secondary | ICD-10-CM | POA: Diagnosis not present

## 2023-11-04 DIAGNOSIS — M25562 Pain in left knee: Secondary | ICD-10-CM | POA: Diagnosis not present

## 2023-11-04 DIAGNOSIS — M25561 Pain in right knee: Secondary | ICD-10-CM | POA: Diagnosis not present

## 2023-11-04 DIAGNOSIS — G8929 Other chronic pain: Secondary | ICD-10-CM | POA: Diagnosis not present

## 2023-11-04 DIAGNOSIS — Z79891 Long term (current) use of opiate analgesic: Secondary | ICD-10-CM | POA: Diagnosis not present

## 2023-11-04 DIAGNOSIS — M170 Bilateral primary osteoarthritis of knee: Secondary | ICD-10-CM | POA: Diagnosis not present

## 2023-11-15 DIAGNOSIS — U071 COVID-19: Secondary | ICD-10-CM | POA: Diagnosis not present

## 2023-11-16 ENCOUNTER — Ambulatory Visit: Payer: Medicare HMO | Admitting: Cardiology

## 2023-11-22 DIAGNOSIS — M170 Bilateral primary osteoarthritis of knee: Secondary | ICD-10-CM | POA: Diagnosis not present

## 2023-11-26 ENCOUNTER — Ambulatory Visit: Attending: Cardiology | Admitting: Cardiology

## 2023-11-26 ENCOUNTER — Encounter: Payer: Self-pay | Admitting: Cardiology

## 2023-11-26 VITALS — BP 126/74 | HR 104 | Ht 63.0 in | Wt 126.6 lb

## 2023-11-26 DIAGNOSIS — I10 Essential (primary) hypertension: Secondary | ICD-10-CM

## 2023-11-26 DIAGNOSIS — I4719 Other supraventricular tachycardia: Secondary | ICD-10-CM

## 2023-11-26 DIAGNOSIS — E782 Mixed hyperlipidemia: Secondary | ICD-10-CM | POA: Diagnosis not present

## 2023-11-26 NOTE — Progress Notes (Unsigned)
 Cardiology Office Note:    Date:  11/26/2023   ID:  Kathryn Herrera, DOB 1955/11/29, MRN [id number]  PCP:  Gaither Juba, MD  Cardiologist:  Ralene Burger, MD    Referring MD: Gaither Juba, MD   Chief Complaint  Patient presents with   Follow-up    History of Present Illness:    Kathryn Herrera is a 68 y.o. female past medical history significant for atrial tachycardia, essential hypertension, dyslipidemia, gastroesophageal reflux disease comes today 2 months for follow-up overall she is doing fine she still active as much as she wants.  Will slow her down a little bit.  Needs send he did have some injections and that seems to be helping.  Past Medical History:  Diagnosis Date   Acquired hypothyroidism 11/09/2019   Chronic pain of right knee 11/09/2019   Essential hypertension 11/09/2019   Gastroesophageal reflux disease without esophagitis 11/09/2019   Inconclusive mammogram 07/12/2018   Migraine without aura and without status migrainosus, not intractable 11/09/2019   Mixed hyperlipidemia 11/09/2019   Primary osteoarthritis of right wrist 03/21/2018   Seasonal allergies 11/09/2019    Past Surgical History:  Procedure Laterality Date   BREAST BIOPSY Left 05/23/2018   Performed at [facility] [place]/ Stereotactic   BREAST BIOPSY Left 07/05/2018   By Dr. Floria Hurst at [facility]/ Needle Biopsy   PLACEMENT OF BREAST IMPLANTS      Current Medications: Current Meds  Medication Sig   Ascorbic Acid (VITAMIN C) 1000 MG tablet Take 1,000 mg by mouth daily.   CALCIUM CITRATE PO Take 1,000 mg by mouth in the morning and at bedtime.   ibandronate (BONIVA) 150 MG tablet Take 150 mg by mouth every 30 (thirty) days. Take in the morning with a full glass of water, on an empty stomach, and do not take anything else by mouth or lie down for the next 30 min.   levothyroxine (SYNTHROID) 75 MCG tablet Take 75 mcg by mouth daily.   lisinopril  (ZESTRIL ) 10 MG  tablet Take 1 tablet (10 mg total) by mouth daily.   metoprolol  succinate (TOPROL  XL) 25 MG 24 hr tablet Take 1 tablet (25 mg total) by mouth daily.   montelukast (SINGULAIR) 10 MG tablet Take 10 mg by mouth daily.   Multiple Vitamin (MULTIVITAMIN) capsule Take 1 capsule by mouth daily.   omeprazole (PRILOSEC) 20 MG capsule Take 20 mg by mouth daily.   pravastatin (PRAVACHOL) 20 MG tablet Take 20 mg by mouth at bedtime.   SUMAtriptan (IMITREX) 100 MG tablet Take 100 mg by mouth every 2 (two) hours as needed for migraine. May repeat in 2 hours if headache persists or recurs.   VITAMIN D, CHOLECALCIFEROL, PO Take 630 mg by mouth 2 (two) times daily.   Wheat Dextrin (BENEFIBER PO) Take 1 tablet by mouth daily.     Allergies:   Amoxicillin, Ampicillin, Levaquin [levofloxacin], and Meloxicam   Social History   Socioeconomic History   Marital status: Divorced    Spouse name: Not on file   Number of children: Not on file   Years of education: Not on file   Highest education level: Not on file  Occupational History   Not on file  Tobacco Use   Smoking status: Former    Current packs/day: 0.00    Types: Cigarettes    Quit date: 2004    Years since quitting: 21.3   Smokeless tobacco: Never  Substance and Sexual Activity   Alcohol use:  Not on file   Drug use: Not on file   Sexual activity: Not on file  Other Topics Concern   Not on file  Social History Narrative   Not on file   Social Drivers of Health   Financial Resource Strain: Not on file  Food Insecurity: Not on file  Transportation Needs: Not on file  Physical Activity: Not on file  Stress: Not on file  Social Connections: Not on file     Family History: The patient's family history includes Breast cancer in her sister; Migraines in her maternal grandmother, mother, and sister. ROS:   Please see the history of present illness.    All 14 point review of systems negative except as described per history of present  illness  EKGs/Labs/Other Studies Reviewed:    EKG Interpretation Date/Time:  Friday Nov 26 2023 16:12:54 EDT Ventricular Rate:  97 PR Interval:  120 QRS Duration:  90 QT Interval:  330 QTC Calculation: 419 R Axis:   52  Text Interpretation: Normal sinus rhythm Possible Left atrial enlargement Borderline ECG No previous ECGs available Confirmed by Ralene Burger [phone]) on 11/26/2023 4:19:51 PM    Recent Labs: No results found for requested labs within last 365 days.  Recent Lipid Panel No results found for: "CHOL", "TRIG", "HDL", "CHOLHDL", "VLDL", "LDLCALC", "LDLDIRECT"  Physical Exam:    VS:  BP 126/74 (BP Location: Right Arm, Patient Position: Sitting)   Pulse (!) 104   Ht 5\' 3"  (1.6 m)   Wt 126 lb 9.6 oz (57.4 kg)   SpO2 96%   BMI 22.43 kg/m     Wt Readings from Last 3 Encounters:  11/26/23 126 lb 9.6 oz (57.4 kg)  05/17/23 120 lb 12.8 oz (54.8 kg)  08/31/22 118 lb (53.5 kg)     GEN:  Well nourished, well developed in no acute distress HEENT: Normal NECK: No JVD; No carotid bruits LYMPHATICS: No lymphadenopathy CARDIAC: RRR, no murmurs, no rubs, no gallops RESPIRATORY:  Clear to auscultation without rales, wheezing or rhonchi  ABDOMEN: Soft, non-tender, non-distended MUSCULOSKELETAL:  No edema; No deformity  SKIN: Warm and dry LOWER EXTREMITIES: no swelling NEUROLOGIC:  Alert and oriented x 3 PSYCHIATRIC:  Normal affect   ASSESSMENT:    1. Atrial tachycardia (HCC)   2. Essential hypertension   3. Mixed hyperlipidemia    PLAN:    In order of problems listed above:  Atrial tachycardia denies have any issues recently.  She did notice however her heart rate increasing when she is trying to build to be more active. Mixed dyslipidemia I did review her coronary calcium score which was 29.9 which is 64 percentile this is 7.4% predicted 10 years risk for having coronary event within the next 10 years.  She is already on pravastatin we will show I will  continue. Essential hypertension blood pressure well-controlled   Medication Adjustments/Labs and Tests Ordered: Current medicines are reviewed at length with the patient today.  Concerns regarding medicines are outlined above.  Orders Placed This Encounter  Procedures   EKG 12-Lead   Medication changes: No orders of the defined types were placed in this encounter.   Signed, Manfred Seed, MD, [facility] 11/26/2023 4:30 PM    [facility]

## 2023-11-26 NOTE — Patient Instructions (Signed)
 Medication Instructions:  Your physician recommends that you continue on your current medications as directed. Please refer to the Current Medication list given to you today.  *If you need a refill on your cardiac medications before your next appointment, please call your pharmacy*  Lab Work: None If you have labs (blood work) drawn today and your tests are completely normal, you will receive your results only by: MyChart Message (if you have MyChart) OR A paper copy in the mail If you have any lab test that is abnormal or we need to change your treatment, we will call you to review the results.  Testing/Procedures: None  Follow-Up: At [facility], you and your health needs are our priority.  As part of our continuing mission to provide you with exceptional heart care, our providers are all part of one team.  This team includes your primary Cardiologist (physician) and Advanced Practice Providers or APPs (Physician Assistants and Nurse Practitioners) who all work together to provide you with the care you need, when you need it.  Your next appointment:   1 year(s)  Provider:   Gypsy Balsam, MD    We recommend signing up for the patient portal called "MyChart".  Sign up information is provided on this After Visit Summary.  MyChart is used to connect with patients for Virtual Visits (Telemedicine).  Patients are able to view lab/test results, encounter notes, upcoming appointments, etc.  Non-urgent messages can be sent to your provider as well.   To learn more about what you can do with MyChart, go to [email or web address].   Other Instructions

## 2023-12-16 DIAGNOSIS — M545 Low back pain, unspecified: Secondary | ICD-10-CM | POA: Diagnosis not present

## 2023-12-16 DIAGNOSIS — M533 Sacrococcygeal disorders, not elsewhere classified: Secondary | ICD-10-CM | POA: Diagnosis not present

## 2023-12-16 DIAGNOSIS — M25562 Pain in left knee: Secondary | ICD-10-CM | POA: Diagnosis not present

## 2023-12-16 DIAGNOSIS — Z6822 Body mass index (BMI) 22.0-22.9, adult: Secondary | ICD-10-CM | POA: Diagnosis not present

## 2023-12-16 DIAGNOSIS — G8929 Other chronic pain: Secondary | ICD-10-CM | POA: Diagnosis not present

## 2023-12-16 DIAGNOSIS — M5442 Lumbago with sciatica, left side: Secondary | ICD-10-CM | POA: Diagnosis not present

## 2023-12-16 DIAGNOSIS — M170 Bilateral primary osteoarthritis of knee: Secondary | ICD-10-CM | POA: Diagnosis not present

## 2023-12-16 DIAGNOSIS — M461 Sacroiliitis, not elsewhere classified: Secondary | ICD-10-CM | POA: Diagnosis not present

## 2023-12-16 DIAGNOSIS — Z79891 Long term (current) use of opiate analgesic: Secondary | ICD-10-CM | POA: Diagnosis not present

## 2023-12-18 ENCOUNTER — Other Ambulatory Visit: Payer: Self-pay | Admitting: Cardiology

## 2023-12-22 DIAGNOSIS — M461 Sacroiliitis, not elsewhere classified: Secondary | ICD-10-CM | POA: Diagnosis not present

## 2023-12-22 DIAGNOSIS — M5451 Vertebrogenic low back pain: Secondary | ICD-10-CM | POA: Diagnosis not present

## 2023-12-24 DIAGNOSIS — H2513 Age-related nuclear cataract, bilateral: Secondary | ICD-10-CM | POA: Diagnosis not present

## 2023-12-30 DIAGNOSIS — M25561 Pain in right knee: Secondary | ICD-10-CM | POA: Diagnosis not present

## 2023-12-30 DIAGNOSIS — M545 Low back pain, unspecified: Secondary | ICD-10-CM | POA: Diagnosis not present

## 2023-12-30 DIAGNOSIS — G8929 Other chronic pain: Secondary | ICD-10-CM | POA: Diagnosis not present

## 2023-12-30 DIAGNOSIS — M170 Bilateral primary osteoarthritis of knee: Secondary | ICD-10-CM | POA: Diagnosis not present

## 2023-12-30 DIAGNOSIS — M5442 Lumbago with sciatica, left side: Secondary | ICD-10-CM | POA: Diagnosis not present

## 2023-12-30 DIAGNOSIS — M25562 Pain in left knee: Secondary | ICD-10-CM | POA: Diagnosis not present

## 2023-12-30 DIAGNOSIS — Z79891 Long term (current) use of opiate analgesic: Secondary | ICD-10-CM | POA: Diagnosis not present

## 2023-12-30 DIAGNOSIS — M5451 Vertebrogenic low back pain: Secondary | ICD-10-CM | POA: Diagnosis not present

## 2023-12-30 DIAGNOSIS — M461 Sacroiliitis, not elsewhere classified: Secondary | ICD-10-CM | POA: Diagnosis not present

## 2024-01-02 DIAGNOSIS — M25552 Pain in left hip: Secondary | ICD-10-CM | POA: Diagnosis not present

## 2024-01-02 DIAGNOSIS — M5489 Other dorsalgia: Secondary | ICD-10-CM | POA: Diagnosis not present

## 2024-01-03 ENCOUNTER — Other Ambulatory Visit: Payer: Self-pay | Admitting: Cardiology

## 2024-01-06 DIAGNOSIS — M5442 Lumbago with sciatica, left side: Secondary | ICD-10-CM | POA: Diagnosis not present

## 2024-01-08 DIAGNOSIS — M5117 Intervertebral disc disorders with radiculopathy, lumbosacral region: Secondary | ICD-10-CM | POA: Diagnosis not present

## 2024-01-13 DIAGNOSIS — Z79891 Long term (current) use of opiate analgesic: Secondary | ICD-10-CM | POA: Diagnosis not present

## 2024-01-13 DIAGNOSIS — M48061 Spinal stenosis, lumbar region without neurogenic claudication: Secondary | ICD-10-CM | POA: Diagnosis not present

## 2024-01-13 DIAGNOSIS — M545 Low back pain, unspecified: Secondary | ICD-10-CM | POA: Diagnosis not present

## 2024-01-13 DIAGNOSIS — M170 Bilateral primary osteoarthritis of knee: Secondary | ICD-10-CM | POA: Diagnosis not present

## 2024-01-13 DIAGNOSIS — M47896 Other spondylosis, lumbar region: Secondary | ICD-10-CM | POA: Diagnosis not present

## 2024-01-13 DIAGNOSIS — M461 Sacroiliitis, not elsewhere classified: Secondary | ICD-10-CM | POA: Diagnosis not present

## 2024-01-13 DIAGNOSIS — G894 Chronic pain syndrome: Secondary | ICD-10-CM | POA: Diagnosis not present

## 2024-01-13 DIAGNOSIS — M5442 Lumbago with sciatica, left side: Secondary | ICD-10-CM | POA: Diagnosis not present

## 2024-01-17 DIAGNOSIS — M47817 Spondylosis without myelopathy or radiculopathy, lumbosacral region: Secondary | ICD-10-CM | POA: Diagnosis not present

## 2024-01-17 DIAGNOSIS — M5451 Vertebrogenic low back pain: Secondary | ICD-10-CM | POA: Diagnosis not present

## 2024-01-20 ENCOUNTER — Inpatient Hospital Stay
Admission: RE | Admit: 2024-01-20 | Discharge: 2024-01-20 | Disposition: A | Discharge status: Home / Self Care | Payer: Self-pay | Source: Ambulatory Visit | Attending: Neurosurgery | Admitting: Neurosurgery

## 2024-01-20 ENCOUNTER — Telehealth: Payer: Self-pay | Admitting: Neurosurgery

## 2024-01-20 ENCOUNTER — Other Ambulatory Visit: Payer: Self-pay | Admitting: Family Medicine

## 2024-01-20 DIAGNOSIS — Z049 Encounter for examination and observation for unspecified reason: Secondary | ICD-10-CM

## 2024-01-20 NOTE — Telephone Encounter (Signed)
 Patient is wanting a second opinion. She is aware we need her records from pain management in [place]. She states she had an MRI on 01/06/2024 and has a copy of the report. I have sent Elenor a message about getting the images.

## 2024-01-20 NOTE — Telephone Encounter (Signed)
 Kathryn Herrera HERO, CMA  [name] F Image is loaded.

## 2024-01-21 NOTE — Telephone Encounter (Signed)
 Her notes are in the chart.

## 2024-01-27 DIAGNOSIS — M2560 Stiffness of unspecified joint, not elsewhere classified: Secondary | ICD-10-CM | POA: Diagnosis not present

## 2024-01-27 DIAGNOSIS — M545 Low back pain, unspecified: Secondary | ICD-10-CM | POA: Diagnosis not present

## 2024-02-01 DIAGNOSIS — M2560 Stiffness of unspecified joint, not elsewhere classified: Secondary | ICD-10-CM | POA: Diagnosis not present

## 2024-02-01 DIAGNOSIS — M545 Low back pain, unspecified: Secondary | ICD-10-CM | POA: Diagnosis not present

## 2024-02-02 NOTE — Progress Notes (Signed)
 Referring Physician:  Ina Marcellus RAMAN, MD [address] [place] [address],   Primary Physician:  Ina Marcellus RAMAN, MD  History of Present Illness: 02/07/2024 [name] is here today with a chief complaint of bilateral paraspinal low back pain that does not radiate into her legs.  Started on the left side approximately 1 day after she was washing her car, then migrated over to the right side.  She was initially diagnosed with sacroiliitis and given a left-sided sacroiliac joint injection.  She did not get immediate relief and a couple weeks later underwent a L5-S1 injection which also did not give her any relief.  Eventually she is continue to have improvement in her pain.  She states that it is not anywhere near the level of pain that she was having previously, but still continues to have some nagging discomfort.  She has been working with physical therapy.  Working with pain medicine.  Notably she also does have some symptoms of waking sensations in her bilateral lower extremities that happen in the middle of the night.  Will often wake her from sleep and she will feel the need to move.  She does not notice numbness or tingling or burning.  She states is mostly a need to move her legs.   Conservative measures:  Physical therapy:  has participated in PT with [facility] in [place] started 01/27/2024-has gone 3 times now. Multimodal medical therapy including regular antiinflammatories:  Meloxicam, Diclofenac, Hydrocodone, Prednisone, Baclofen Injections: 01/17/2024 L5-S1 Facet Joint Injection 11/22/2023 Nerve block bilateral knees  Past Surgery: none  The symptoms are causing a significant impact on the patient's life.   I have utilized the care everywhere function in epic to review the outside records available from external health systems.  Review of Systems:  A 10 point review of systems is negative, except for the pertinent positives and negatives detailed in the  HPI.  Past Medical History: Past Medical History:  Diagnosis Date   Acquired hypothyroidism 11/09/2019   Chronic pain of right knee 11/09/2019   Essential hypertension 11/09/2019   Gastroesophageal reflux disease without esophagitis 11/09/2019   Inconclusive mammogram 07/12/2018   Migraine without aura and without status migrainosus, not intractable 11/09/2019   Mixed hyperlipidemia 11/09/2019   Primary osteoarthritis of right wrist 03/21/2018   Seasonal allergies 11/09/2019    Past Surgical History: Past Surgical History:  Procedure Laterality Date   BREAST BIOPSY Left 05/23/2018   Performed at [facility]/ Stereotactic   BREAST BIOPSY Left 07/05/2018   By Dr. Cay Shaggy at [facility]/ Needle Biopsy   PLACEMENT OF BREAST IMPLANTS      Allergies: Allergies as of 02/07/2024 - Review Complete 11/26/2023  Allergen Reaction Noted   Amoxicillin Hives 03/21/2018   Ampicillin Hives and Rash 03/21/2018   Baclofen  02/07/2024   Levaquin [levofloxacin] Other (See Comments) 08/18/2022   Meloxicam Hypertension 08/18/2022    Medications:  Current Outpatient Medications:    Ascorbic Acid (VITAMIN C) 1000 MG tablet, Take 1,000 mg by mouth daily., Disp: , Rfl:    CALCIUM CITRATE PO, Take 1,000 mg by mouth in the morning and at bedtime., Disp: , Rfl:    ibandronate (BONIVA) 150 MG tablet, Take 150 mg by mouth every 30 (thirty) days. Take in the morning with a full glass of water, on an empty stomach, and do not take anything else by mouth or lie down for the next 30 min., Disp: , Rfl:  levothyroxine (SYNTHROID) 75 MCG tablet, Take 75 mcg by mouth daily., Disp: , Rfl:    lisinopril  (ZESTRIL ) 10 MG tablet, TAKE 1 TABLET BY MOUTH DAILY, Disp: 90 tablet, Rfl: 3   montelukast (SINGULAIR) 10 MG tablet, Take 10 mg by mouth daily., Disp: , Rfl:    Multiple Vitamin (MULTIVITAMIN) capsule, Take 1 capsule by mouth daily., Disp: , Rfl:    omeprazole (PRILOSEC) 20 MG capsule,  Take 20 mg by mouth daily., Disp: , Rfl:    pravastatin (PRAVACHOL) 20 MG tablet, Take 20 mg by mouth at bedtime., Disp: , Rfl:    SUMAtriptan (IMITREX) 100 MG tablet, Take 100 mg by mouth every 2 (two) hours as needed for migraine. May repeat in 2 hours if headache persists or recurs., Disp: , Rfl:    VITAMIN D, CHOLECALCIFEROL, PO, Take 630 mg by mouth 2 (two) times daily., Disp: , Rfl:    Wheat Dextrin (BENEFIBER PO), Take 1 tablet by mouth daily., Disp: , Rfl:   Social History: Social History   Tobacco Use   Smoking status: Former    Current packs/day: 0.00    Types: Cigarettes    Quit date: 2004    Years since quitting: 21.5   Smokeless tobacco: Never    Family Medical History: Family History  Problem Relation Age of Onset   Migraines Mother    Migraines Sister    Breast cancer Sister    Migraines Maternal Grandmother     Physical Examination: Vitals:   02/07/24 1519  BP: (!) 148/94    General: Patient is in no apparent distress. Attention to examination is appropriate.  Neck:   Supple.  Full range of motion.  Respiratory: Patient is breathing without any difficulty.   NEUROLOGICAL:     Awake, alert, oriented to person, place, and time.  Speech is clear and fluent.   Cranial Nerves: Pupils equal round and reactive to light.  Facial tone is symmetric.  Facial sensation is symmetric. Shoulder shrug is symmetric. Tongue protrusion is midline.    Strength:  Side Iliopsoas Quads Hamstring PF DF EHL  R 5 5 5 5 5 5   L 5 5 5 5 5 5    Fortin finger test positive bilaterally, compression test positive bilaterally, pelvic torsion test positive bilaterally, pelvic distraction test positive bilaterally.  Imaging: I have personally reviewed her imaging.  She has no evidence of compressive etiologies on her lumbar spine.  She does have very mild diffuse spondylosis, she also has a positive Schmorl's node.  More interestingly, bilaterally in the lateral aspect of the sacrum  bilaterally she does have a significant amount of T2 signal abnormality/STIR signal.  She does have some irregularity of the SI joint clearly on the left and some on the right as well.    Medical Decision Making/Assessment and Plan: [name] is a pleasant 68 y.o. female with a 8-week flare of pain in bilateral buttocks region.  No radiation down into her legs.  No new numbness weakness or tingling.  She was originally diagnosed with possible SI joint issues so got a steroid injection.  She did not have an immediate relief and therefore underwent an L5-S1 injection shortly thereafter.  Since then she has had some slow and steady improvement.  She is still not back to her baseline and still has some nagging discomfort.  This is also on the contralateral side as well.  On physical examination she has full strength with no evidence of neurologic compression.  Her SI  joint examination shows a positive Fortin sign, positive compression test, positive distraction test, and pain with pelvic rotation.  These do reproduce her symptoms.  I reviewed her imaging, it shows some spondylosis noted, some facet arthropathy with unilateral T2 signal change in the left L5-S1 joint but no significant splaying.  She does however have STIR signal abnormality noted laterally near the level of the SI joints bilaterally.  Left worse than right.  Clinically I believe she has a episode of sacroiliitis, the timeframe for recovery from her injections would be consistent with that as she started to get some relief after 2 to 3 weeks of her steroid injection in the left SI joint.  She has more mild symptoms on the right.  This was a steroid only shot without any anesthetic component.  This would explain the lack of any immediate response.  I did let her know that she should continue with physical therapy as I think the most likely diagnosis in her case is sacroiliitis.  She has had an improvement but has not returned to her baseline.  She  is quite worried about having a repeat flare.  I did let her know that it is very difficult to predict what will cause another flare or worsening of her symptoms.  But I do think she would benefit from ongoing physical therapy.  Should she have another flare could consider a SI joint block with a steroid injection for more immediate relief.  For longer-term treatment some patients respond to ablation type therapies, however I will defer this to the pain provider.  At this point I do not think she has symptomatic lumbar spondylosis.  Again reviewing her imaging shows that she does have a significant sacral slope which does put her at more shear stress at the sacroiliac joint.  In regards to her waking symptoms in her bilateral lower extremities, this could be multifactorial.  She has had borderline diabetes for many years.  This could be neuropathy or the initial presentation of peripheral neuropathy.  She does not carry a diagnosis of formal diabetes but she has had borderline elevated sugars for many years which can also put you at risk.  Alternatively she could be considered to have restless leg syndrome, her symptoms seem to be quite consistent with this etiology as she has not complaining of significant numbness and tingling rather a sensation of feeling need to move her feet and get up and move her legs.  I asked that she reach out to her primary care provider for further workup.   Thank you for involving me in the care of this patient.    Penne MICAEL Sharps MD/MSCR Neurosurgery

## 2024-02-03 DIAGNOSIS — M545 Low back pain, unspecified: Secondary | ICD-10-CM | POA: Diagnosis not present

## 2024-02-03 DIAGNOSIS — M2560 Stiffness of unspecified joint, not elsewhere classified: Secondary | ICD-10-CM | POA: Diagnosis not present

## 2024-02-07 ENCOUNTER — Ambulatory Visit: Admitting: Neurosurgery

## 2024-02-07 VITALS — BP 148/94 | Ht 63.0 in | Wt 122.4 lb

## 2024-02-07 DIAGNOSIS — R208 Other disturbances of skin sensation: Secondary | ICD-10-CM

## 2024-02-07 DIAGNOSIS — M461 Sacroiliitis, not elsewhere classified: Secondary | ICD-10-CM | POA: Diagnosis not present

## 2024-02-07 DIAGNOSIS — G2581 Restless legs syndrome: Secondary | ICD-10-CM

## 2024-02-08 DIAGNOSIS — M545 Low back pain, unspecified: Secondary | ICD-10-CM | POA: Diagnosis not present

## 2024-02-08 DIAGNOSIS — M2560 Stiffness of unspecified joint, not elsewhere classified: Secondary | ICD-10-CM | POA: Diagnosis not present

## 2024-02-10 DIAGNOSIS — M545 Low back pain, unspecified: Secondary | ICD-10-CM | POA: Diagnosis not present

## 2024-02-10 DIAGNOSIS — M2560 Stiffness of unspecified joint, not elsewhere classified: Secondary | ICD-10-CM | POA: Diagnosis not present

## 2024-02-14 DIAGNOSIS — M2560 Stiffness of unspecified joint, not elsewhere classified: Secondary | ICD-10-CM | POA: Diagnosis not present

## 2024-02-14 DIAGNOSIS — M545 Low back pain, unspecified: Secondary | ICD-10-CM | POA: Diagnosis not present

## 2024-02-16 DIAGNOSIS — M2560 Stiffness of unspecified joint, not elsewhere classified: Secondary | ICD-10-CM | POA: Diagnosis not present

## 2024-02-16 DIAGNOSIS — M545 Low back pain, unspecified: Secondary | ICD-10-CM | POA: Diagnosis not present

## 2024-02-21 DIAGNOSIS — M25569 Pain in unspecified knee: Secondary | ICD-10-CM | POA: Diagnosis not present

## 2024-02-21 DIAGNOSIS — M47896 Other spondylosis, lumbar region: Secondary | ICD-10-CM | POA: Diagnosis not present

## 2024-02-21 DIAGNOSIS — M5451 Vertebrogenic low back pain: Secondary | ICD-10-CM | POA: Diagnosis not present

## 2024-02-21 DIAGNOSIS — M461 Sacroiliitis, not elsewhere classified: Secondary | ICD-10-CM | POA: Diagnosis not present

## 2024-02-21 DIAGNOSIS — M48061 Spinal stenosis, lumbar region without neurogenic claudication: Secondary | ICD-10-CM | POA: Diagnosis not present

## 2024-02-21 DIAGNOSIS — Z79891 Long term (current) use of opiate analgesic: Secondary | ICD-10-CM | POA: Diagnosis not present

## 2024-02-21 DIAGNOSIS — G894 Chronic pain syndrome: Secondary | ICD-10-CM | POA: Diagnosis not present

## 2024-02-21 DIAGNOSIS — M170 Bilateral primary osteoarthritis of knee: Secondary | ICD-10-CM | POA: Diagnosis not present

## 2024-02-23 DIAGNOSIS — M545 Low back pain, unspecified: Secondary | ICD-10-CM | POA: Diagnosis not present

## 2024-02-23 DIAGNOSIS — M2560 Stiffness of unspecified joint, not elsewhere classified: Secondary | ICD-10-CM | POA: Diagnosis not present

## 2024-02-24 DIAGNOSIS — M545 Low back pain, unspecified: Secondary | ICD-10-CM | POA: Diagnosis not present

## 2024-02-24 DIAGNOSIS — M2560 Stiffness of unspecified joint, not elsewhere classified: Secondary | ICD-10-CM | POA: Diagnosis not present

## 2024-02-28 DIAGNOSIS — M170 Bilateral primary osteoarthritis of knee: Secondary | ICD-10-CM | POA: Diagnosis not present

## 2024-02-29 DIAGNOSIS — M545 Low back pain, unspecified: Secondary | ICD-10-CM | POA: Diagnosis not present

## 2024-02-29 DIAGNOSIS — M2560 Stiffness of unspecified joint, not elsewhere classified: Secondary | ICD-10-CM | POA: Diagnosis not present

## 2024-03-02 DIAGNOSIS — M2560 Stiffness of unspecified joint, not elsewhere classified: Secondary | ICD-10-CM | POA: Diagnosis not present

## 2024-03-02 DIAGNOSIS — M545 Low back pain, unspecified: Secondary | ICD-10-CM | POA: Diagnosis not present

## 2024-03-09 DIAGNOSIS — M2560 Stiffness of unspecified joint, not elsewhere classified: Secondary | ICD-10-CM | POA: Diagnosis not present

## 2024-03-09 DIAGNOSIS — M545 Low back pain, unspecified: Secondary | ICD-10-CM | POA: Diagnosis not present

## 2024-04-03 DIAGNOSIS — M9904 Segmental and somatic dysfunction of sacral region: Secondary | ICD-10-CM | POA: Diagnosis not present

## 2024-04-03 DIAGNOSIS — M9903 Segmental and somatic dysfunction of lumbar region: Secondary | ICD-10-CM | POA: Diagnosis not present

## 2024-04-03 DIAGNOSIS — M9905 Segmental and somatic dysfunction of pelvic region: Secondary | ICD-10-CM | POA: Diagnosis not present

## 2024-04-03 DIAGNOSIS — M51362 Other intervertebral disc degeneration, lumbar region with discogenic back pain and lower extremity pain: Secondary | ICD-10-CM | POA: Diagnosis not present

## 2024-04-07 DIAGNOSIS — M9904 Segmental and somatic dysfunction of sacral region: Secondary | ICD-10-CM | POA: Diagnosis not present

## 2024-04-07 DIAGNOSIS — Z79891 Long term (current) use of opiate analgesic: Secondary | ICD-10-CM | POA: Diagnosis not present

## 2024-04-07 DIAGNOSIS — M25569 Pain in unspecified knee: Secondary | ICD-10-CM | POA: Diagnosis not present

## 2024-04-07 DIAGNOSIS — M47896 Other spondylosis, lumbar region: Secondary | ICD-10-CM | POA: Diagnosis not present

## 2024-04-07 DIAGNOSIS — M51362 Other intervertebral disc degeneration, lumbar region with discogenic back pain and lower extremity pain: Secondary | ICD-10-CM | POA: Diagnosis not present

## 2024-04-07 DIAGNOSIS — M5451 Vertebrogenic low back pain: Secondary | ICD-10-CM | POA: Diagnosis not present

## 2024-04-07 DIAGNOSIS — G894 Chronic pain syndrome: Secondary | ICD-10-CM | POA: Diagnosis not present

## 2024-04-07 DIAGNOSIS — M48061 Spinal stenosis, lumbar region without neurogenic claudication: Secondary | ICD-10-CM | POA: Diagnosis not present

## 2024-04-07 DIAGNOSIS — M9903 Segmental and somatic dysfunction of lumbar region: Secondary | ICD-10-CM | POA: Diagnosis not present

## 2024-04-07 DIAGNOSIS — M170 Bilateral primary osteoarthritis of knee: Secondary | ICD-10-CM | POA: Diagnosis not present

## 2024-04-07 DIAGNOSIS — M9905 Segmental and somatic dysfunction of pelvic region: Secondary | ICD-10-CM | POA: Diagnosis not present

## 2024-04-10 DIAGNOSIS — M9905 Segmental and somatic dysfunction of pelvic region: Secondary | ICD-10-CM | POA: Diagnosis not present

## 2024-04-10 DIAGNOSIS — M9904 Segmental and somatic dysfunction of sacral region: Secondary | ICD-10-CM | POA: Diagnosis not present

## 2024-04-10 DIAGNOSIS — M9903 Segmental and somatic dysfunction of lumbar region: Secondary | ICD-10-CM | POA: Diagnosis not present

## 2024-04-10 DIAGNOSIS — M51362 Other intervertebral disc degeneration, lumbar region with discogenic back pain and lower extremity pain: Secondary | ICD-10-CM | POA: Diagnosis not present

## 2024-04-17 DIAGNOSIS — M810 Age-related osteoporosis without current pathological fracture: Secondary | ICD-10-CM | POA: Diagnosis not present

## 2024-04-17 DIAGNOSIS — M9903 Segmental and somatic dysfunction of lumbar region: Secondary | ICD-10-CM | POA: Diagnosis not present

## 2024-04-17 DIAGNOSIS — M9904 Segmental and somatic dysfunction of sacral region: Secondary | ICD-10-CM | POA: Diagnosis not present

## 2024-04-17 DIAGNOSIS — M9905 Segmental and somatic dysfunction of pelvic region: Secondary | ICD-10-CM | POA: Diagnosis not present

## 2024-04-17 DIAGNOSIS — E039 Hypothyroidism, unspecified: Secondary | ICD-10-CM | POA: Diagnosis not present

## 2024-04-17 DIAGNOSIS — Z6821 Body mass index (BMI) 21.0-21.9, adult: Secondary | ICD-10-CM | POA: Diagnosis not present

## 2024-04-17 DIAGNOSIS — M9902 Segmental and somatic dysfunction of thoracic region: Secondary | ICD-10-CM | POA: Diagnosis not present

## 2024-04-17 DIAGNOSIS — M9901 Segmental and somatic dysfunction of cervical region: Secondary | ICD-10-CM | POA: Diagnosis not present

## 2024-04-17 DIAGNOSIS — Z79899 Other long term (current) drug therapy: Secondary | ICD-10-CM | POA: Diagnosis not present

## 2024-04-17 DIAGNOSIS — I10 Essential (primary) hypertension: Secondary | ICD-10-CM | POA: Diagnosis not present

## 2024-04-17 DIAGNOSIS — Z0000 Encounter for general adult medical examination without abnormal findings: Secondary | ICD-10-CM | POA: Diagnosis not present

## 2024-04-17 DIAGNOSIS — M51362 Other intervertebral disc degeneration, lumbar region with discogenic back pain and lower extremity pain: Secondary | ICD-10-CM | POA: Diagnosis not present

## 2024-04-17 DIAGNOSIS — G2581 Restless legs syndrome: Secondary | ICD-10-CM | POA: Diagnosis not present

## 2024-04-21 DIAGNOSIS — M9904 Segmental and somatic dysfunction of sacral region: Secondary | ICD-10-CM | POA: Diagnosis not present

## 2024-04-21 DIAGNOSIS — M9901 Segmental and somatic dysfunction of cervical region: Secondary | ICD-10-CM | POA: Diagnosis not present

## 2024-04-21 DIAGNOSIS — M9902 Segmental and somatic dysfunction of thoracic region: Secondary | ICD-10-CM | POA: Diagnosis not present

## 2024-04-21 DIAGNOSIS — M51362 Other intervertebral disc degeneration, lumbar region with discogenic back pain and lower extremity pain: Secondary | ICD-10-CM | POA: Diagnosis not present

## 2024-04-21 DIAGNOSIS — M9903 Segmental and somatic dysfunction of lumbar region: Secondary | ICD-10-CM | POA: Diagnosis not present

## 2024-04-21 DIAGNOSIS — M9905 Segmental and somatic dysfunction of pelvic region: Secondary | ICD-10-CM | POA: Diagnosis not present

## 2024-05-01 DIAGNOSIS — L578 Other skin changes due to chronic exposure to nonionizing radiation: Secondary | ICD-10-CM | POA: Diagnosis not present

## 2024-05-01 DIAGNOSIS — L821 Other seborrheic keratosis: Secondary | ICD-10-CM | POA: Diagnosis not present

## 2024-05-01 DIAGNOSIS — L820 Inflamed seborrheic keratosis: Secondary | ICD-10-CM | POA: Diagnosis not present

## 2024-05-01 DIAGNOSIS — D225 Melanocytic nevi of trunk: Secondary | ICD-10-CM | POA: Diagnosis not present

## 2024-05-03 DIAGNOSIS — Z23 Encounter for immunization: Secondary | ICD-10-CM | POA: Diagnosis not present

## 2024-05-03 DIAGNOSIS — Z6821 Body mass index (BMI) 21.0-21.9, adult: Secondary | ICD-10-CM | POA: Diagnosis not present

## 2024-05-03 DIAGNOSIS — R109 Unspecified abdominal pain: Secondary | ICD-10-CM | POA: Diagnosis not present

## 2024-05-12 DIAGNOSIS — R1012 Left upper quadrant pain: Secondary | ICD-10-CM | POA: Diagnosis not present

## 2024-05-16 DIAGNOSIS — Z1231 Encounter for screening mammogram for malignant neoplasm of breast: Secondary | ICD-10-CM | POA: Diagnosis not present

## 2024-05-17 DIAGNOSIS — M170 Bilateral primary osteoarthritis of knee: Secondary | ICD-10-CM | POA: Diagnosis not present

## 2024-05-17 DIAGNOSIS — M25561 Pain in right knee: Secondary | ICD-10-CM | POA: Diagnosis not present

## 2024-05-17 DIAGNOSIS — I10 Essential (primary) hypertension: Secondary | ICD-10-CM | POA: Diagnosis not present

## 2024-08-07 ENCOUNTER — Telehealth: Payer: Self-pay | Admitting: *Deleted

## 2024-08-07 NOTE — Telephone Encounter (Signed)
" ° °  Name: Kathryn Herrera  DOB: 03-08-1956  MRN: [id number]  Primary Cardiologist: None   Preoperative team, please contact this patient and set up a phone call appointment for further preoperative risk assessment. Please obtain consent and complete medication review. Thank you for your help.  I confirm that guidance regarding antiplatelet and oral anticoagulation therapy has been completed and, if necessary, noted below.  Patient is not on anticoagulation or antiplatelet per review of current medical record in Epic.    I also confirmed the patient resides in the state of [place] . As per [facility] Medical Board telemedicine laws, the patient must reside in the state in which the provider is licensed.   Lamarr Satterfield, NP 08/07/2024, 11:40 AM [facility]    "

## 2024-08-07 NOTE — Telephone Encounter (Signed)
 Patient scheduled for pre-op clearance on 08/23/24 with Glendia Ferrier, PA-C.     Patient Consent for Virtual Visit        Kathryn Herrera has provided verbal consent on 08/07/2024 for a virtual visit (video or telephone).   CONSENT FOR VIRTUAL VISIT FOR:  Kathryn Herrera  By participating in this virtual visit I agree to the following:  I hereby voluntarily request, consent and authorize [facility] and its employed or contracted physicians, physician assistants, nurse practitioners or other licensed health care professionals (the Practitioner), to provide me with telemedicine health care services (the Services) as deemed necessary by the treating Practitioner. I acknowledge and consent to receive the Services by the Practitioner via telemedicine. I understand that the telemedicine visit will involve communicating with the Practitioner through live audiovisual communication technology and the disclosure of certain medical information by electronic transmission. I acknowledge that I have been given the opportunity to request an in-person assessment or other available alternative prior to the telemedicine visit and am voluntarily participating in the telemedicine visit.  I understand that I have the right to withhold or withdraw my consent to the use of telemedicine in the course of my care at any time, without affecting my right to future care or treatment, and that the Practitioner or I may terminate the telemedicine visit at any time. I understand that I have the right to inspect all information obtained and/or recorded in the course of the telemedicine visit and may receive copies of available information for a reasonable fee.  I understand that some of the potential risks of receiving the Services via telemedicine include:  Delay or interruption in medical evaluation due to technological equipment failure or disruption; Information transmitted may not be sufficient (e.g. poor  resolution of images) to allow for appropriate medical decision making by the Practitioner; and/or  In rare instances, security protocols could fail, causing a breach of personal health information.  Furthermore, I acknowledge that it is my responsibility to provide information about my medical history, conditions and care that is complete and accurate to the best of my ability. I acknowledge that Practitioner's advice, recommendations, and/or decision may be based on factors not within their control, such as incomplete or inaccurate data provided by me or distortions of diagnostic images or specimens that may result from electronic transmissions. I understand that the practice of medicine is not an exact science and that Practitioner makes no warranties or guarantees regarding treatment outcomes. I acknowledge that a copy of this consent can be made available to me via my patient portal [facility] MyChart), or I can request a printed copy by calling the office of [facility].    I understand that my insurance will be billed for this visit.   I have read or had this consent read to me. I understand the contents of this consent, which adequately explains the benefits and risks of the Services being provided via telemedicine.  I have been provided ample opportunity to ask questions regarding this consent and the Services and have had my questions answered to my satisfaction. I give my informed consent for the services to be provided through the use of telemedicine in my medical care

## 2024-08-07 NOTE — Telephone Encounter (Signed)
"  ° °  Pre-operative Risk Assessment    Patient Name: Kathryn Herrera  DOB: 1956-04-30 MRN: [id number]      Request for Surgical Clearance    Procedure:  Total Knee Arthroplasty  Date of Surgery:  Clearance 09/22/24                                 Surgeon:  Norleen SAUNDERS. Georgina, MD Surgeon's Group or Practice Name:  [facility] Phone number:  [phone] Fax number:  [phone]   Type of Clearance Requested:   - Medical    Type of Anesthesia:  Spinal   Additional requests/questions:  Please fax a copy of any testing performed as requirement for this surgery to the surgeon's office.  Signed, Arloa Donovan Dines   08/07/2024, 11:19 AM   "

## 2024-08-08 ENCOUNTER — Encounter: Payer: Self-pay | Admitting: Cardiology

## 2024-08-23 ENCOUNTER — Telehealth (HOSPITAL_BASED_OUTPATIENT_CLINIC_OR_DEPARTMENT_OTHER): Payer: Self-pay | Admitting: *Deleted

## 2024-08-23 ENCOUNTER — Encounter: Payer: Self-pay | Admitting: Physician Assistant

## 2024-08-23 ENCOUNTER — Ambulatory Visit: Admitting: Physician Assistant

## 2024-08-23 DIAGNOSIS — Z01810 Encounter for preprocedural cardiovascular examination: Secondary | ICD-10-CM | POA: Diagnosis not present

## 2024-08-23 NOTE — Progress Notes (Signed)
"  ° °  Virtual Visit via Telephone Note for Surgical Clearance    Because of Kathryn Herrera co-morbid illnesses, she is at least at moderate risk for complications without adequate follow up.  This format is felt to be most appropriate for this patient at this time.  Due to technical limitations with video connection (technology), today's appointment will be conducted as an audio only telehealth visit, and Kathryn Herrera verbally agreed to proceed in this manner.   All issues noted in this document were discussed and addressed.  No physical exam could be performed with this format.  Evaluation Performed:  Preoperative cardiovascular risk assessment _____________   Date:  08/23/2024   Patient ID:  Kathryn Herrera, DOB 05-22-1956, MRN [id number]  Patient Location:  Provider location:  Home Office   Primary Care Provider:  Ina Marcellus RAMAN, MD Primary Cardiologist:  None  Patient Profile  Coronary artery Ca2+ CAC score 09/10/2023: 29.9 (64th percentile) Mild aortic insufficiency TTE 09/11/2022: EF 60-65, mild AI Atrial tachycardia Hypertension  Hyperlipidemia   History of Present Illness    Kathryn Herrera is a 69 y.o. female who presents via audio/video conferencing for a telehealth visit today.   Pt was last seen in cardiology clinic on 11/26/2023 by Dr. Bernie.  At that time Kathryn Herrera was doing well .    Pending Surgery: Knee replacement Type of anesthesia: Spinal Medications to be held: n/a  Since her last visit, she has done well w/o chest pain, shortness of breath, syncope.    Physical Exam  Vital Signs:  Kathryn Herrera does not have vital signs available for review today. Given telephonic nature of communication, physical exam is limited. AAOx3. NAD. Normal affect.  Speech and respirations are unlabored.    Assessment & Plan   Assessment & Plan Preoperative cardiovascular examination Kathryn Herrera's perioperative risk of a major cardiac  event is 0.4% according to the Revised Cardiac Risk Index (RCRI).  Therefore, she is at low risk for perioperative complications.   Her functional capacity is fair at 4.06 METs according to the Duke Activity Status Index (DASI). Recommendations: According to ACC/AHA guidelines, no further cardiovascular testing needed.  The patient may proceed to surgery at acceptable risk.     A copy of this note will be routed to requesting surgeon.  Time:   Today, I have spent 7 minutes with the patient with telehealth technology discussing medical history, symptoms, and management plan.     Kathryn Ferrier, PA-C 08/23/2024, 3:01 PM  "

## 2024-08-23 NOTE — Telephone Encounter (Signed)
 Per preop APP Glendia Ferrier, [facility] asked if I would mail his notes from today to the pt per pt request. I have placed the ov notes from today in the mail to the pt.

## 2024-08-23 NOTE — Telephone Encounter (Signed)
 Notes faxed to surgeon.  Glendia Ferrier, PA-C  08/23/2024 4:54 PM

## 2024-11-24 ENCOUNTER — Ambulatory Visit: Admitting: Cardiology
